# Patient Record
Sex: Female | Born: 1990 | Race: Black or African American | Hispanic: No | Marital: Single | Smoking: Never smoker
Health system: Southern US, Community
[De-identification: ages and names within clinical notes are randomized; demographics above are authoritative.]

## PROBLEM LIST (undated history)

## (undated) DIAGNOSIS — J45909 Unspecified asthma, uncomplicated: Secondary | ICD-10-CM

## (undated) DIAGNOSIS — L309 Dermatitis, unspecified: Secondary | ICD-10-CM

## (undated) HISTORY — PX: TUBAL LIGATION: SHX77

---

## 2015-12-23 ENCOUNTER — Encounter (HOSPITAL_COMMUNITY): Payer: Self-pay

## 2015-12-23 DIAGNOSIS — M545 Low back pain: Secondary | ICD-10-CM | POA: Insufficient documentation

## 2015-12-23 DIAGNOSIS — Z91040 Latex allergy status: Secondary | ICD-10-CM | POA: Insufficient documentation

## 2015-12-23 DIAGNOSIS — Z872 Personal history of diseases of the skin and subcutaneous tissue: Secondary | ICD-10-CM | POA: Insufficient documentation

## 2015-12-23 DIAGNOSIS — J45901 Unspecified asthma with (acute) exacerbation: Secondary | ICD-10-CM | POA: Insufficient documentation

## 2015-12-23 DIAGNOSIS — Z79899 Other long term (current) drug therapy: Secondary | ICD-10-CM | POA: Insufficient documentation

## 2015-12-23 MED ORDER — ALBUTEROL SULFATE (2.5 MG/3ML) 0.083% IN NEBU
INHALATION_SOLUTION | RESPIRATORY_TRACT | Status: AC
  Filled 2015-12-23: qty 6

## 2015-12-23 MED ORDER — ALBUTEROL SULFATE (2.5 MG/3ML) 0.083% IN NEBU
5.0000 mg | INHALATION_SOLUTION | Freq: Once | RESPIRATORY_TRACT | Status: AC
  Administered 2015-12-23: 5 mg via RESPIRATORY_TRACT

## 2015-12-23 NOTE — ED Notes (Signed)
Pt here for asthma exacerbation. She states for the past 2 weeks she has a flare up when just walking short distances. Pt has exp wheezing at triage, RR unlabored, speaking clear complete sentences. She reports HA.

## 2015-12-24 ENCOUNTER — Emergency Department (HOSPITAL_COMMUNITY)
Admission: EM | Admit: 2015-12-24 | Discharge: 2015-12-24 | Disposition: A | Discharge status: Home / Self Care | Payer: Self-pay | Attending: Emergency Medicine | Admitting: Emergency Medicine

## 2015-12-24 DIAGNOSIS — J45901 Unspecified asthma with (acute) exacerbation: Secondary | ICD-10-CM

## 2015-12-24 HISTORY — DX: Dermatitis, unspecified: L30.9

## 2015-12-24 HISTORY — DX: Unspecified asthma, uncomplicated: J45.909

## 2015-12-24 MED ORDER — PREDNISONE 20 MG PO TABS
60.0000 mg | ORAL_TABLET | Freq: Once | ORAL | Status: AC
  Administered 2015-12-24: 60 mg via ORAL
  Filled 2015-12-24: qty 3

## 2015-12-24 MED ORDER — IPRATROPIUM-ALBUTEROL 0.5-2.5 (3) MG/3ML IN SOLN
3.0000 mL | Freq: Once | RESPIRATORY_TRACT | Status: AC
  Administered 2015-12-24: 3 mL via RESPIRATORY_TRACT
  Filled 2015-12-24: qty 3

## 2015-12-24 MED ORDER — PREDNISONE 50 MG PO TABS: ORAL_TABLET | ORAL | Status: DC

## 2015-12-24 MED ORDER — ALBUTEROL SULFATE (2.5 MG/3ML) 0.083% IN NEBU
INHALATION_SOLUTION | RESPIRATORY_TRACT | Status: AC
  Filled 2015-12-24: qty 3

## 2015-12-24 MED ORDER — ALBUTEROL SULFATE HFA 108 (90 BASE) MCG/ACT IN AERS: 1.0000 | INHALATION_SPRAY | Freq: Four times a day (QID) | RESPIRATORY_TRACT | Status: DC | PRN

## 2015-12-24 MED ORDER — IPRATROPIUM BROMIDE 0.02 % IN SOLN
RESPIRATORY_TRACT | Status: DC
Start: 2015-12-24 — End: 2015-12-24
  Filled 2015-12-24: qty 2.5

## 2015-12-24 NOTE — Discharge Instructions (Signed)
Asthma, Adult Asthma is a condition of the lungs in which the airways tighten and narrow. Asthma can make it hard to breathe. Asthma cannot be cured, but medicine and lifestyle changes can help control it. Asthma may be started (triggered) by:  Animal skin flakes (dander).  Dust.  Cockroaches.  Pollen.  Mold.  Smoke.  Cleaning products.  Hair sprays or aerosol sprays.  Paint fumes or strong smells.  Cold air, weather changes, and winds.  Crying or laughing hard.  Stress.  Certain medicines or drugs.  Foods, such as dried fruit, potato chips, and sparkling grape juice.  Infections or conditions (colds, flu).  Exercise.  Certain medical conditions or diseases.  Exercise or tiring activities. HOME CARE   Take medicine as told by your doctor.  Use a peak flow meter as told by your doctor. A peak flow meter is a tool that measures how well the lungs are working.  Record and keep track of the peak flow meter's readings.  Understand and use the asthma action plan. An asthma action plan is a written plan for taking care of your asthma and treating your attacks.  To help prevent asthma attacks:  Do not smoke. Stay away from secondhand smoke.  Change your heating and air conditioning filter often.  Limit your use of fireplaces and wood stoves.  Get rid of pests (such as roaches and mice) and their droppings.  Throw away plants if you see mold on them.  Clean your floors. Dust regularly. Use cleaning products that do not smell.  Have someone vacuum when you are not home. Use a vacuum cleaner with a HEPA filter if possible.  Replace carpet with wood, tile, or vinyl flooring. Carpet can trap animal skin flakes and dust.  Use allergy-proof pillows, mattress covers, and box spring covers.  Wash bed sheets and blankets every week in hot water and dry them in a dryer.  Use blankets that are made of polyester or cotton.  Clean bathrooms and kitchens with bleach.  If possible, have someone repaint the walls in these rooms with mold-resistant paint. Keep out of the rooms that are being cleaned and painted.  Wash hands often. GET HELP IF:  You have make a whistling sound when breaking (wheeze), have shortness of breath, or have a cough even if taking medicine to prevent attacks.  The colored mucus you cough up (sputum) is thicker than usual.  The colored mucus you cough up changes from clear or white to yellow, green, gray, or bloody.  You have problems from the medicine you are taking such as:  A rash.  Itching.  Swelling.  Trouble breathing.  You need reliever medicines more than 2-3 times a week.  Your peak flow measurement is still at 50-79% of your personal best after following the action plan for 1 hour.  You have a fever. GET HELP RIGHT AWAY IF:   You seem to be worse and are not responding to medicine during an asthma attack.  You are short of breath even at rest.  You get short of breath when doing very little activity.  You have trouble eating, drinking, or talking.  You have chest pain.  You have a fast heartbeat.  Your lips or fingernails start to turn blue.  You are light-headed, dizzy, or faint.  Your peak flow is less than 50% of your personal best.   This information is not intended to replace advice given to you by your health care provider. Make sure   you discuss any questions you have with your health care provider.   Document Released: 03/12/2008 Document Revised: 06/15/2015 Document Reviewed: 04/23/2013 Elsevier Interactive Patient Education 2016 [employer]  

## 2015-12-24 NOTE — ED Provider Notes (Signed)
CSN: [id number]     Arrival date & time 12/23/15  2324 History  By signing my name below, I, Doreatha Martin, attest that this documentation has been prepared under the direction and in the presence of Zadie Rhine, MD. Electronically Signed: Doreatha Martin, ED Scribe. 12/24/2015. 2:44 AM.    Chief Complaint  Patient presents with  . Asthma   The history is provided by the patient. No language interpreter was used.   HPI Comments: Glenda Merritt is a 25 y.o. female with h/o asthma who presents to the Emergency Department complaining of moderate, worsening SOB onset 2 weeks ago with associated occasionally productive cough, chest tightness, wheezing. She also reports mild lower back pain, which is exacerbated with breathing. Pt states improvement of symptoms with breathing tx given in the ED. She states she has an albuterol inhaler at home. Pt states h/o similar symptoms with prior asthma flare-ups, but more severe. She states her asthma has been very well controlled since age 14. She has not been using steroids recently. Pt is a non-smoker. No daily medications. Pt does not take birth control. She denies stabbing CP, emesis, fever, hemoptysis, abdominal pain, leg swelling.   Past Medical History  Diagnosis Date  . Asthma   . Eczema    Past Surgical History  Procedure Laterality Date  . Tubal ligation     No family history on file. Social History  Substance Use Topics  . Smoking status: Never Smoker   . Smokeless tobacco: None  . Alcohol Use: Yes   OB History    No data available     Review of Systems  Constitutional: Negative for fever.  Respiratory: Positive for cough, chest tightness, shortness of breath and wheezing.   Cardiovascular: Negative for chest pain and leg swelling.  Gastrointestinal: Negative for vomiting and abdominal pain.  Musculoskeletal: Positive for back pain.  All other systems reviewed and are negative.  Allergies  Shellfish allergy and Latex  Home  Medications   Prior to Admission medications   Medication Sig Start Date End Date Taking? Authorizing Provider  albuterol (PROVENTIL HFA;VENTOLIN HFA) 108 (90 Base) MCG/ACT inhaler Inhale 2 puffs into the lungs every 6 (six) hours as needed for wheezing or shortness of breath.   Yes Historical Provider, MD   BP 109/65 mmHg  Pulse 92  Temp(Src) 97.7 F (36.5 C) (Oral)  Resp 23  SpO2 98%  LMP 12/06/2015 Physical Exam CONSTITUTIONAL: Well developed/well nourished HEAD: Normocephalic/atraumatic EYES: EOMI/PERRL ENMT: Mucous membranes moist NECK: supple no meningeal signs SPINE/BACK:entire spine nontender CV: S1/S2 noted, no murmurs/rubs/gallops noted LUNGS: Wheezing bilaterally, but no distress noted.  ABDOMEN: soft, nontender NEURO: Pt is awake/alert/appropriate, moves all extremitiesx4.  No facial droop.   EXTREMITIES: pulses normal/equal, full ROM. No lower extremity edema.  SKIN: warm, color normal. Eczema noted.  PSYCH: no abnormalities of mood noted, alert and oriented to situation   ED Course  Procedures  Medications  albuterol (PROVENTIL) (2.5 MG/3ML) 0.083% nebulizer solution 5 mg (5 mg Nebulization Given 12/23/15 2331)  ipratropium-albuterol (DUONEB) 0.5-2.5 (3) MG/3ML nebulizer solution 3 mL (3 mLs Nebulization Given 12/24/15 0116)  predniSONE (DELTASONE) tablet 60 mg (60 mg Oral Given 12/24/15 0116)    DIAGNOSTIC STUDIES: Oxygen Saturation is 99% on RA, normal by my interpretation.    COORDINATION OF CARE: 2:42 AM Discussed treatment plan with pt at bedside which includes breathing treatment, steroids and pt agreed to plan.   On my arrival pt was sleeping She felt improved No hypoxia She  did have residual wheeze and I offered more nebs, but she declined Will place on prednisone for homegoing Pt stable for d/c home I doubt ACS/PE/CHF at this time as cause of her symptoms   MDM   Final diagnoses:  Asthma attack    Nursing notes including past medical  history and social history reviewed and considered in documentation   I personally performed the services described in this documentation, which was scribed in my presence. The recorded information has been reviewed and is accurate.       Zadie Rhineonald Mesa Janus, MD 12/24/15 [phone]

## 2015-12-24 NOTE — ED Notes (Signed)
Pt left with all his belongings and ambulated out of the treatment area.  

## 2016-12-08 ENCOUNTER — Encounter (HOSPITAL_COMMUNITY): Payer: Self-pay

## 2016-12-08 ENCOUNTER — Emergency Department (HOSPITAL_COMMUNITY)
Admission: EM | Admit: 2016-12-08 | Discharge: 2016-12-08 | Disposition: A | Discharge status: Home / Self Care | Payer: Self-pay | Attending: Emergency Medicine | Admitting: Emergency Medicine

## 2016-12-08 DIAGNOSIS — J45909 Unspecified asthma, uncomplicated: Secondary | ICD-10-CM | POA: Insufficient documentation

## 2016-12-08 DIAGNOSIS — N390 Urinary tract infection, site not specified: Secondary | ICD-10-CM

## 2016-12-08 DIAGNOSIS — Z91040 Latex allergy status: Secondary | ICD-10-CM | POA: Insufficient documentation

## 2016-12-08 LAB — URINALYSIS, ROUTINE W REFLEX MICROSCOPIC
Bilirubin Urine: NEGATIVE
Glucose, UA: NEGATIVE mg/dL
Hgb urine dipstick: NEGATIVE
KETONES UR: NEGATIVE mg/dL
Nitrite: NEGATIVE
PROTEIN: 100 mg/dL — AB
Specific Gravity, Urine: 1.024 (ref 1.005–1.030)
pH: 5 (ref 5.0–8.0)

## 2016-12-08 MED ORDER — IBUPROFEN 200 MG PO TABS
400.0000 mg | ORAL_TABLET | Freq: Once | ORAL | Status: AC
  Administered 2016-12-08: 400 mg via ORAL
  Filled 2016-12-08: qty 2

## 2016-12-08 MED ORDER — CEPHALEXIN 500 MG PO CAPS
1000.0000 mg | ORAL_CAPSULE | Freq: Once | ORAL | Status: AC
  Administered 2016-12-08: 1000 mg via ORAL
  Filled 2016-12-08: qty 2

## 2016-12-08 MED ORDER — PHENAZOPYRIDINE HCL 200 MG PO TABS: 200.0000 mg | ORAL_TABLET | Freq: Three times a day (TID) | ORAL | 0 refills | Status: DC

## 2016-12-08 MED ORDER — CEPHALEXIN 500 MG PO CAPS: 500.0000 mg | ORAL_CAPSULE | Freq: Four times a day (QID) | ORAL | 0 refills | Status: DC

## 2016-12-08 MED ORDER — PHENAZOPYRIDINE HCL 200 MG PO TABS
200.0000 mg | ORAL_TABLET | Freq: Once | ORAL | Status: AC
  Administered 2016-12-08: 200 mg via ORAL
  Filled 2016-12-08: qty 1

## 2016-12-08 NOTE — ED Notes (Signed)
Pt taken to collect urine specimen. 

## 2016-12-08 NOTE — ED Notes (Addendum)
Pt c/o frequent urination and dysuria over 2 weeks with pain and frequency increasing over two weeks. Pt states using AZO pills to help.

## 2016-12-08 NOTE — ED Provider Notes (Signed)
[facility]-EMERGENCY DEPT Provider Note   CSN: [id number] Arrival date & time: 12/08/16  2116     History   Chief Complaint Chief Complaint  Patient presents with  . Urinary Frequency    HPI Glenda Merritt is a 26 y.o. female.  Patient c/o urinary frequency, urgency, and dysuria, for the past 2 weeks. Tried otc meds, but didn't help. Hx uncomplicated bladder infxn/uti. No recent abx use. No fever or chills. No vomiting. Other than suprapubic discomfort, no abd pain. No flank pain. Denies vaginal d/c or bleeding. Prior tubal ligation.    The history is provided by the patient.  Urinary Frequency     Past Medical History:  Diagnosis Date  . Asthma   . Eczema     There are no active problems to display for this patient.   Past Surgical History:  Procedure Laterality Date  . TUBAL LIGATION      OB History    No data available       Home Medications    Prior to Admission medications   Medication Sig Start Date End Date Taking? Authorizing Provider  albuterol (PROVENTIL HFA;VENTOLIN HFA) 108 (90 Base) MCG/ACT inhaler Inhale 1-2 puffs into the lungs every 6 (six) hours as needed for wheezing or shortness of breath. 12/24/15  Yes Zadie Rhine, MD  Amlodipine-Olmesartan (AZOR PO) Take 2 tablets by mouth once.   Yes Historical Provider, MD  predniSONE (DELTASONE) 50 MG tablet One tablet PO daily for 4 days Patient not taking: Reported on 12/08/2016 12/24/15   Zadie Rhine, MD    Family History History reviewed. No pertinent family history.  Social History Social History  Substance Use Topics  . Smoking status: Never Smoker  . Smokeless tobacco: Never Used  . Alcohol use Yes     Allergies   Shellfish allergy and Latex   Review of Systems Review of Systems  Constitutional: Negative for chills and fever.  Gastrointestinal: Negative for vomiting.  Genitourinary: Positive for dysuria and frequency.     Physical Exam Updated Vital Signs BP 135/85 (BP  Location: Right Arm)   Pulse 85   Temp 98.3 F (36.8 C) (Oral)   Resp 18   Ht 5\' 1"  (1.549 m)   Wt 119.1 kg   SpO2 100%   BMI 49.62 kg/m   Physical Exam  Constitutional: She appears well-developed and well-nourished. No distress.  HENT:  Head: Atraumatic.  Eyes: Conjunctivae are normal. No scleral icterus.  Neck: Neck supple. No tracheal deviation present.  Cardiovascular: Normal rate.   Pulmonary/Chest: Effort normal. No respiratory distress.  Abdominal: Normal appearance. She exhibits no distension. There is no tenderness.  Genitourinary:  Genitourinary Comments: No cva tenderness  Musculoskeletal: She exhibits no edema.  Neurological: She is alert.  Skin: Skin is warm and dry. No rash noted.  Psychiatric: She has a normal mood and affect.  Nursing note and vitals reviewed.    ED Treatments / Results  Labs (all labs ordered are listed, but only abnormal results are displayed) Results for orders placed or performed during the hospital encounter of 12/08/16  Urinalysis, Routine w reflex microscopic  Result Value Ref Range   Color, Urine YELLOW YELLOW   APPearance CLOUDY (A) CLEAR   Specific Gravity, Urine 1.024 1.005 - 1.030   pH 5.0 5.0 - 8.0   Glucose, UA NEGATIVE NEGATIVE mg/dL   Hgb urine dipstick NEGATIVE NEGATIVE   Bilirubin Urine NEGATIVE NEGATIVE   Ketones, ur NEGATIVE NEGATIVE mg/dL   Protein, ur 782 (  A) NEGATIVE mg/dL   Nitrite NEGATIVE NEGATIVE   Leukocytes, UA LARGE (A) NEGATIVE   RBC / HPF 6-30 0 - 5 RBC/hpf   WBC, UA TOO NUMEROUS TO COUNT 0 - 5 WBC/hpf   Bacteria, UA RARE (A) NONE SEEN   Squamous Epithelial / LPF 0-5 (A) NONE SEEN   WBC Clumps PRESENT    Mucous PRESENT    EKG  EKG Interpretation None       Radiology No results found.  Procedures Procedures (including critical care time)  Medications Ordered in ED Medications  ibuprofen (ADVIL,MOTRIN) tablet 400 mg (not administered)  cephALEXin (KEFLEX) capsule 1,000 mg (not  administered)  phenazopyridine (PYRIDIUM) tablet 200 mg (not administered)     Initial Impression / Assessment and Plan / ED Course  I have reviewed the triage vital signs and the nursing notes.  Pertinent labs & imaging results that were available during my care of the patient were reviewed by me and considered in my medical decision making (see chart for details).  Confirmed nkda.   Keflex po. Motrin po.   Pt appears stable for d/c.     Final Clinical Impressions(s) / ED Diagnoses   Final diagnoses:  None    New Prescriptions New Prescriptions   No medications on file     Cathren LaineKevin Nema Oatley, MD 12/08/16 2242

## 2016-12-08 NOTE — ED Triage Notes (Signed)
Burning with urination for about 2 weeks with nausea voiced no hematuria.

## 2016-12-08 NOTE — ED Notes (Signed)
All triage done by Clerance LavBrian Anajah Sterbenz RN

## 2016-12-08 NOTE — Discharge Instructions (Signed)
It was our pleasure to provide your ER care today - we hope that you feel better.  Drink plenty of fluids.  Take antibiotic (keflex) as prescribed.  Take pyridium as need for bladder spams.  Take acetaminophen and ibuprofen as need for pain.   Follow up with primary care doctor in the next few days for recheck if symptoms fail to improve/resolve.  Return to ER if worse, high fevers, persistent vomiting, severe abdominal pain, other concern.

## 2017-06-24 ENCOUNTER — Encounter (HOSPITAL_COMMUNITY): Payer: Self-pay

## 2017-06-24 ENCOUNTER — Emergency Department (HOSPITAL_COMMUNITY)
Admission: EM | Admit: 2017-06-24 | Discharge: 2017-06-24 | Disposition: A | Discharge status: Home / Self Care | Payer: Self-pay | Attending: Emergency Medicine | Admitting: Emergency Medicine

## 2017-06-24 DIAGNOSIS — R109 Unspecified abdominal pain: Secondary | ICD-10-CM | POA: Insufficient documentation

## 2017-06-24 DIAGNOSIS — Z5321 Procedure and treatment not carried out due to patient leaving prior to being seen by health care provider: Secondary | ICD-10-CM | POA: Insufficient documentation

## 2017-06-24 LAB — CBC
HCT: 36 % (ref 36.0–46.0)
HEMOGLOBIN: 11.7 g/dL — AB (ref 12.0–15.0)
MCH: 26.1 pg (ref 26.0–34.0)
MCHC: 32.5 g/dL (ref 30.0–36.0)
MCV: 80.2 fL (ref 78.0–100.0)
PLATELETS: 303 10*3/uL (ref 150–400)
RBC: 4.49 MIL/uL (ref 3.87–5.11)
RDW: 14.1 % (ref 11.5–15.5)
WBC: 8.8 10*3/uL (ref 4.0–10.5)

## 2017-06-24 LAB — COMPREHENSIVE METABOLIC PANEL
ALK PHOS: 54 U/L (ref 38–126)
ALT: 22 U/L (ref 14–54)
ANION GAP: 9 (ref 5–15)
AST: 30 U/L (ref 15–41)
Albumin: 4.2 g/dL (ref 3.5–5.0)
BUN: 15 mg/dL (ref 6–20)
CALCIUM: 9.5 mg/dL (ref 8.9–10.3)
CO2: 23 mmol/L (ref 22–32)
CREATININE: 1.01 mg/dL — AB (ref 0.44–1.00)
Chloride: 106 mmol/L (ref 101–111)
Glucose, Bld: 111 mg/dL — ABNORMAL HIGH (ref 65–99)
Potassium: 3.2 mmol/L — ABNORMAL LOW (ref 3.5–5.1)
SODIUM: 138 mmol/L (ref 135–145)
Total Bilirubin: 0.4 mg/dL (ref 0.3–1.2)
Total Protein: 8.1 g/dL (ref 6.5–8.1)

## 2017-06-24 LAB — I-STAT BETA HCG BLOOD, ED (MC, WL, AP ONLY)

## 2017-06-24 LAB — LIPASE, BLOOD: LIPASE: 21 U/L (ref 11–51)

## 2017-06-24 MED ORDER — ONDANSETRON 4 MG PO TBDP
4.0000 mg | ORAL_TABLET | Freq: Once | ORAL | Status: AC | PRN
  Administered 2017-06-24: 4 mg via ORAL
  Filled 2017-06-24: qty 1

## 2017-06-24 NOTE — ED Notes (Signed)
Pt has made two unsuccessful attempts to provide a urine specimen at this point.

## 2017-06-24 NOTE — ED Notes (Signed)
Pt came out of the room asking about the wait and if she could be skipped ahead due to the fact that she has work in a couple of hours. She was informed that was something we could not do.

## 2017-06-24 NOTE — ED Notes (Signed)
Pt awoke from sleep with abdominal pain that radiates on the right around to the back. Reports nausea, denies diarrhea.

## 2017-06-24 NOTE — ED Notes (Signed)
Pt updated on wait.  Sts nausea has decreased.  Pt seems to be more comfortable.

## 2018-03-22 ENCOUNTER — Emergency Department (HOSPITAL_COMMUNITY)
Admission: EM | Admit: 2018-03-22 | Discharge: 2018-03-22 | Disposition: A | Discharge status: Home / Self Care | Payer: Self-pay | Attending: Emergency Medicine | Admitting: Emergency Medicine

## 2018-03-22 ENCOUNTER — Encounter (HOSPITAL_COMMUNITY): Payer: Self-pay | Admitting: *Deleted

## 2018-03-22 DIAGNOSIS — R1013 Epigastric pain: Secondary | ICD-10-CM | POA: Insufficient documentation

## 2018-03-22 DIAGNOSIS — R112 Nausea with vomiting, unspecified: Secondary | ICD-10-CM | POA: Insufficient documentation

## 2018-03-22 LAB — URINALYSIS, ROUTINE W REFLEX MICROSCOPIC
BILIRUBIN URINE: NEGATIVE
Glucose, UA: NEGATIVE mg/dL
KETONES UR: NEGATIVE mg/dL
Leukocytes, UA: NEGATIVE
NITRITE: NEGATIVE
PH: 5 (ref 5.0–8.0)
Protein, ur: 100 mg/dL — AB
Specific Gravity, Urine: 1.021 (ref 1.005–1.030)

## 2018-03-22 LAB — CBC WITH DIFFERENTIAL/PLATELET
BASOS PCT: 0 %
Basophils Absolute: 0 10*3/uL (ref 0.0–0.1)
Eosinophils Absolute: 0.2 10*3/uL (ref 0.0–0.7)
Eosinophils Relative: 3 %
HEMATOCRIT: 35.5 % — AB (ref 36.0–46.0)
HEMOGLOBIN: 11.5 g/dL — AB (ref 12.0–15.0)
LYMPHS ABS: 2 10*3/uL (ref 0.7–4.0)
LYMPHS PCT: 33 %
MCH: 26.9 pg (ref 26.0–34.0)
MCHC: 32.4 g/dL (ref 30.0–36.0)
MCV: 82.9 fL (ref 78.0–100.0)
Monocytes Absolute: 0.3 10*3/uL (ref 0.1–1.0)
Monocytes Relative: 5 %
NEUTROS ABS: 3.6 10*3/uL (ref 1.7–7.7)
NEUTROS PCT: 59 %
Platelets: 262 10*3/uL (ref 150–400)
RBC: 4.28 MIL/uL (ref 3.87–5.11)
RDW: 13.8 % (ref 11.5–15.5)
WBC: 6.1 10*3/uL (ref 4.0–10.5)

## 2018-03-22 LAB — COMPREHENSIVE METABOLIC PANEL
ALBUMIN: 3.9 g/dL (ref 3.5–5.0)
ALT: 17 U/L (ref 14–54)
AST: 18 U/L (ref 15–41)
Alkaline Phosphatase: 47 U/L (ref 38–126)
Anion gap: 4 — ABNORMAL LOW (ref 5–15)
BUN: 11 mg/dL (ref 6–20)
CHLORIDE: 106 mmol/L (ref 101–111)
CO2: 30 mmol/L (ref 22–32)
Calcium: 9 mg/dL (ref 8.9–10.3)
Creatinine, Ser: 1.01 mg/dL — ABNORMAL HIGH (ref 0.44–1.00)
GFR calc Af Amer: >60 mL/min (ref >60)
GFR calc non Af Amer: >60 mL/min (ref >60)
GLUCOSE: 88 mg/dL (ref 65–99)
POTASSIUM: 3.7 mmol/L (ref 3.5–5.1)
SODIUM: 140 mmol/L (ref 135–145)
TOTAL PROTEIN: 7.4 g/dL (ref 6.5–8.1)
Total Bilirubin: 0.5 mg/dL (ref 0.3–1.2)

## 2018-03-22 LAB — PREGNANCY, URINE: PREG TEST UR: NEGATIVE

## 2018-03-22 LAB — LIPASE, BLOOD: Lipase: 21 U/L (ref 11–51)

## 2018-03-22 MED ORDER — DICYCLOMINE HCL 20 MG PO TABS: 20.0000 mg | ORAL_TABLET | Freq: Two times a day (BID) | ORAL | 0 refills | Status: DC

## 2018-03-22 MED ORDER — ONDANSETRON HCL 4 MG/2ML IJ SOLN
4.0000 mg | Freq: Once | INTRAMUSCULAR | Status: AC
  Administered 2018-03-22: 4 mg via INTRAVENOUS
  Filled 2018-03-22: qty 2

## 2018-03-22 MED ORDER — SODIUM CHLORIDE 0.9 % IV BOLUS
1000.0000 mL | Freq: Once | INTRAVENOUS | Status: AC
  Administered 2018-03-22: 1000 mL via INTRAVENOUS

## 2018-03-22 MED ORDER — GI COCKTAIL ~~LOC~~
30.0000 mL | Freq: Once | ORAL | Status: AC
  Administered 2018-03-22: 30 mL via ORAL
  Filled 2018-03-22: qty 30

## 2018-03-22 MED ORDER — SUCRALFATE 1 G PO TABS
1.0000 g | ORAL_TABLET | Freq: Once | ORAL | Status: AC
  Administered 2018-03-22: 1 g via ORAL
  Filled 2018-03-22: qty 1

## 2018-03-22 MED ORDER — SUCRALFATE 1 GM/10ML PO SUSP: 1.0000 g | Freq: Three times a day (TID) | ORAL | 0 refills | Status: DC

## 2018-03-22 MED ORDER — METOCLOPRAMIDE HCL 5 MG/ML IJ SOLN
10.0000 mg | Freq: Once | INTRAMUSCULAR | Status: AC
Start: 2018-03-22 — End: 2018-03-22
  Administered 2018-03-22: 10 mg via INTRAVENOUS
  Filled 2018-03-22: qty 2

## 2018-03-22 MED ORDER — ONDANSETRON 4 MG PO TBDP: 4.0000 mg | ORAL_TABLET | Freq: Three times a day (TID) | ORAL | 0 refills | Status: DC | PRN

## 2018-03-22 NOTE — Discharge Instructions (Signed)

## 2018-03-22 NOTE — ED Triage Notes (Signed)
Pt complains of abdominal pain, nausea, vomiting since last night. Pt denies diarrhea.

## 2018-03-22 NOTE — ED Notes (Signed)
Patient state she want to be discharged she have emergency at home. Pt refused discharged vital sign.

## 2018-03-22 NOTE — ED Provider Notes (Signed)
Emergency Department Provider Note   I have reviewed the triage vital signs and the nursing notes.   HISTORY  Chief Complaint Abdominal Pain   HPI Glenda Merritt is a 27 y.o. female with PMH of Asthma resents to the emergency department for evaluation of epigastric abdominal pain with associated nausea and vomiting.  Symptoms began yesterday afternoon/evening when she was driving to work to work the third shift.  She describes intermittent, sharp pains in the epigastric region.  No radiation of pain.  No modifying factors.  She developed nausea and vomiting during work but did not leave work.  She drank a seltzer water which did not improve her symptoms.  She was unable to eat dinner so is unsure how food affects this.  She has not tried any other medications.  She denies any lower abdominal pain.  No blood in the emesis.  No diarrhea.  Denies any chest pain or difficulty breathing.  Unknown fevers but nothing subjective.   Past Medical History:  Diagnosis Date  . Asthma   . Eczema     There are no active problems to display for this patient.   Past Surgical History:  Procedure Laterality Date  . TUBAL LIGATION      Current Outpatient Rx  . Order #: [id number] Class: Historical Med  . Order #: [id number] Class: Print  . Order #: [id number] Class: Print  . Order #: [id number] Class: Print  . Order #: [id number] Class: Print  . Order #: [id number] Class: Print  . Order #: [id number] Class: Print  . Order #: [id number] Class: Print    Allergies Shellfish allergy; Lactose intolerance (gi); and Latex  No family history on file.  Social History Social History   Tobacco Use  . Smoking status: Never Smoker  . Smokeless tobacco: Never Used  Substance Use Topics  . Alcohol use: Yes  . Drug use: Yes    Types: Marijuana    Review of Systems  Constitutional: No fever/chills Eyes: No visual changes. ENT: No sore throat. Cardiovascular: Denies chest pain. Respiratory: Denies  shortness of breath. Gastrointestinal: Positive epigastric abdominal pain. Positive nausea and vomiting.  No diarrhea.  No constipation. Genitourinary: Negative for dysuria. Musculoskeletal: Negative for back pain. Skin: Negative for rash. Neurological: Negative for headaches, focal weakness or numbness.  10-point ROS otherwise negative.  ____________________________________________   PHYSICAL EXAM:  VITAL SIGNS: ED Triage Vitals  Enc Vitals Group     BP 03/22/18 0743 118/85     Pulse Rate 03/22/18 0743 96     Resp 03/22/18 0743 18     Temp 03/22/18 0743 98.5 F (36.9 C)     Temp Source 03/22/18 0743 Oral     SpO2 03/22/18 0743 98 %     Pain Score 03/22/18 0748 10   Constitutional: Alert and oriented. Well appearing and in no acute distress. Eyes: Conjunctivae are normal.  Head: Atraumatic. Nose: No congestion/rhinnorhea. Mouth/Throat: Mucous membranes are moist.  Oropharynx non-erythematous. Neck: No stridor.  Cardiovascular: Normal rate, regular rhythm. Good peripheral circulation. Grossly normal heart sounds.   Respiratory: Normal respiratory effort.  No retractions. Lungs CTAB. Gastrointestinal: Soft with mild epigastric tenderness. No rebound or guarding. Negative Murphy's sign. No distention.  Musculoskeletal: No lower extremity tenderness nor edema. No gross deformities of extremities. Neurologic:  Normal speech and language. No gross focal neurologic deficits are appreciated.  Skin:  Skin is warm, dry and intact. No rash noted.  ____________________________________________   LABS (all labs ordered are listed, but only abnormal results are displayed)  Labs Reviewed  URINALYSIS, ROUTINE W REFLEX MICROSCOPIC - Abnormal; Notable for the following components:      Result Value   Hgb urine dipstick LARGE (*)    Protein, ur 100 (*)    RBC / HPF >50 (*)    Bacteria, UA RARE (*)    All other components within normal limits  COMPREHENSIVE METABOLIC PANEL - Abnormal;  Notable for the following components:   Creatinine, Ser 1.01 (*)    Anion gap 4 (*)    All other components within normal limits  CBC WITH DIFFERENTIAL/PLATELET - Abnormal; Notable for the following components:   Hemoglobin 11.5 (*)    HCT 35.5 (*)    All other components within normal limits  PREGNANCY, URINE  LIPASE, BLOOD   ____________________________________________  RADIOLOGY  None ____________________________________________   PROCEDURES  Procedure(s) performed:   Procedures  None ____________________________________________   INITIAL IMPRESSION / ASSESSMENT AND PLAN / ED COURSE  Pertinent labs & imaging results that were available during my care of the patient were reviewed by me and considered in my medical decision making (see chart for details).  Patient presents to the emergency department for evaluation of epigastric abdominal discomfort with nausea and vomiting.  She has mild, diffuse epigastric tenderness with no focal right upper quadrant tenderness.  Negative Murphy sign.  Doubt cardiothoracic etiology given patient's age and presentation.  Plan for IV fluids, Zofran, Carafate and follow labs including UA and CMP. Only surgical history is a tubal ligation.   10:37 AM Patient feeling improved and states that she needs to get back to her children.  No concern for acute surgical process.  No indication for abdominal imaging at this time.  Plan for supportive care at home.  Return precautions provided.   At this time, I do not feel there is any life-threatening condition present. I have reviewed and discussed all results (EKG, imaging, lab, urine as appropriate), exam findings with patient. I have reviewed nursing notes and appropriate previous records.  I feel the patient is safe to be discharged home without further emergent workup. Discussed usual and customary return precautions. Patient and family (if present) verbalize understanding and are comfortable with  this plan.  Patient will follow-up with their primary care provider. If they do not have a primary care provider, information for follow-up has been provided to them. All questions have been answered.  ____________________________________________  FINAL CLINICAL IMPRESSION(S) / ED DIAGNOSES  Final diagnoses:  Epigastric pain  Non-intractable vomiting with nausea, unspecified vomiting type     MEDICATIONS GIVEN DURING THIS VISIT:  Medications  sodium chloride 0.9 % bolus 1,000 mL (0 mLs Intravenous Stopped 03/22/18 1013)  ondansetron (ZOFRAN) injection 4 mg (4 mg Intravenous Given 03/22/18 0824)  sucralfate (CARAFATE) tablet 1 g (1 g Oral Given 03/22/18 0827)  gi cocktail (Maalox,Lidocaine,Donnatal) (30 mLs Oral Given 03/22/18 1012)  metoCLOPramide (REGLAN) injection 10 mg (10 mg Intravenous Given 03/22/18 1012)     NEW OUTPATIENT MEDICATIONS STARTED DURING THIS VISIT:  New Prescriptions   DICYCLOMINE (BENTYL) 20 MG TABLET    Take 1 tablet (20 mg total) by mouth 2 (two) times daily.   ONDANSETRON (ZOFRAN ODT) 4 MG DISINTEGRATING TABLET    Take 1 tablet (4 mg total) by mouth every 8 (eight) hours as needed for nausea or vomiting.   SUCRALFATE (CARAFATE) 1 GM/10ML SUSPENSION    Take 10 mLs (1 g total) by mouth 4 (four) times daily -  with meals and at bedtime.  Note:  This document was prepared using Dragon voice recognition software and may include unintentional dictation errors.  Alona Bene, MD Emergency Medicine    Long, Arlyss Repress, MD 03/22/18 1038

## 2018-07-31 ENCOUNTER — Encounter (HOSPITAL_COMMUNITY): Payer: Self-pay | Admitting: *Deleted

## 2018-07-31 ENCOUNTER — Observation Stay (HOSPITAL_COMMUNITY)
Admission: EM | Admit: 2018-07-31 | Discharge: 2018-08-02 | Disposition: A | Discharge status: Home / Self Care | Payer: Self-pay | Attending: Surgery | Admitting: Surgery

## 2018-07-31 ENCOUNTER — Emergency Department (HOSPITAL_COMMUNITY): Payer: Self-pay

## 2018-07-31 ENCOUNTER — Encounter (HOSPITAL_COMMUNITY): Payer: Self-pay

## 2018-07-31 ENCOUNTER — Ambulatory Visit (HOSPITAL_COMMUNITY)
Admission: EM | Admit: 2018-07-31 | Discharge: 2018-07-31 | Disposition: A | Discharge status: Home / Self Care | Payer: Self-pay | Attending: Family Medicine | Admitting: Family Medicine

## 2018-07-31 DIAGNOSIS — Z79899 Other long term (current) drug therapy: Secondary | ICD-10-CM | POA: Insufficient documentation

## 2018-07-31 DIAGNOSIS — K8020 Calculus of gallbladder without cholecystitis without obstruction: Secondary | ICD-10-CM | POA: Diagnosis present

## 2018-07-31 DIAGNOSIS — K819 Cholecystitis, unspecified: Secondary | ICD-10-CM

## 2018-07-31 DIAGNOSIS — Z6841 Body Mass Index (BMI) 40.0 and over, adult: Secondary | ICD-10-CM | POA: Insufficient documentation

## 2018-07-31 DIAGNOSIS — K8010 Calculus of gallbladder with chronic cholecystitis without obstruction: Principal | ICD-10-CM | POA: Insufficient documentation

## 2018-07-31 DIAGNOSIS — R1013 Epigastric pain: Secondary | ICD-10-CM | POA: Insufficient documentation

## 2018-07-31 DIAGNOSIS — Z91013 Allergy to seafood: Secondary | ICD-10-CM | POA: Insufficient documentation

## 2018-07-31 DIAGNOSIS — R110 Nausea: Secondary | ICD-10-CM | POA: Insufficient documentation

## 2018-07-31 DIAGNOSIS — J45909 Unspecified asthma, uncomplicated: Secondary | ICD-10-CM | POA: Insufficient documentation

## 2018-07-31 DIAGNOSIS — R1011 Right upper quadrant pain: Secondary | ICD-10-CM

## 2018-07-31 DIAGNOSIS — Z91040 Latex allergy status: Secondary | ICD-10-CM | POA: Insufficient documentation

## 2018-07-31 DIAGNOSIS — Z9049 Acquired absence of other specified parts of digestive tract: Secondary | ICD-10-CM

## 2018-07-31 LAB — COMPREHENSIVE METABOLIC PANEL
ALBUMIN: 3.9 g/dL (ref 3.5–5.0)
ALK PHOS: 52 U/L (ref 38–126)
ALT: 16 U/L (ref 0–44)
ALT: 17 U/L (ref 0–44)
ANION GAP: 10 (ref 5–15)
ANION GAP: 9 (ref 5–15)
AST: 18 U/L (ref 15–41)
AST: 18 U/L (ref 15–41)
Albumin: 4.2 g/dL (ref 3.5–5.0)
Alkaline Phosphatase: 51 U/L (ref 38–126)
BILIRUBIN TOTAL: 0.5 mg/dL (ref 0.3–1.2)
BUN: 7 mg/dL (ref 6–20)
BUN: 9 mg/dL (ref 6–20)
CALCIUM: 9.9 mg/dL (ref 8.9–10.3)
CALCIUM: 9.7 mg/dL (ref 8.9–10.3)
CO2: 25 mmol/L (ref 22–32)
CO2: 26 mmol/L (ref 22–32)
CREATININE: 0.95 mg/dL (ref 0.44–1.00)
CREATININE: 0.96 mg/dL (ref 0.44–1.00)
Chloride: 102 mmol/L (ref 98–111)
Chloride: 103 mmol/L (ref 98–111)
GFR calc Af Amer: >60 mL/min (ref >60)
GFR calc non Af Amer: >60 mL/min (ref >60)
GLUCOSE: 95 mg/dL (ref 70–99)
Glucose, Bld: 97 mg/dL (ref 70–99)
POTASSIUM: 3.6 mmol/L (ref 3.5–5.1)
Potassium: 3.6 mmol/L (ref 3.5–5.1)
SODIUM: 138 mmol/L (ref 135–145)
Sodium: 137 mmol/L (ref 135–145)
Total Bilirubin: 0.4 mg/dL (ref 0.3–1.2)
Total Protein: 7.4 g/dL (ref 6.5–8.1)
Total Protein: 8 g/dL (ref 6.5–8.1)

## 2018-07-31 LAB — CBC WITH DIFFERENTIAL/PLATELET
Abs Immature Granulocytes: 0.01 10*3/uL (ref 0.00–0.07)
BASOS ABS: 0 10*3/uL (ref 0.0–0.1)
Basophils Relative: 1 %
EOS ABS: 0.2 10*3/uL (ref 0.0–0.5)
EOS PCT: 4 %
HCT: 40.3 % (ref 36.0–46.0)
Hemoglobin: 12.5 g/dL (ref 12.0–15.0)
IMMATURE GRANULOCYTES: 0 %
LYMPHS ABS: 2 10*3/uL (ref 0.7–4.0)
Lymphocytes Relative: 30 %
MCH: 25.6 pg — ABNORMAL LOW (ref 26.0–34.0)
MCHC: 31 g/dL (ref 30.0–36.0)
MCV: 82.6 fL (ref 80.0–100.0)
Monocytes Absolute: 0.5 10*3/uL (ref 0.1–1.0)
Monocytes Relative: 8 %
NEUTROS PCT: 57 %
Neutro Abs: 3.8 10*3/uL (ref 1.7–7.7)
PLATELETS: 312 10*3/uL (ref 150–400)
RBC: 4.88 MIL/uL (ref 3.87–5.11)
RDW: 13.5 % (ref 11.5–15.5)
WBC: 6.5 10*3/uL (ref 4.0–10.5)
nRBC: 0 % (ref 0.0–0.2)

## 2018-07-31 LAB — URINALYSIS, ROUTINE W REFLEX MICROSCOPIC
BACTERIA UA: NONE SEEN
Bilirubin Urine: NEGATIVE
GLUCOSE, UA: NEGATIVE mg/dL
Hgb urine dipstick: NEGATIVE
Ketones, ur: NEGATIVE mg/dL
Leukocytes, UA: NEGATIVE
NITRITE: NEGATIVE
Protein, ur: 100 mg/dL — AB
SPECIFIC GRAVITY, URINE: 1.025 (ref 1.005–1.030)
pH: 5 (ref 5.0–8.0)

## 2018-07-31 LAB — I-STAT BETA HCG BLOOD, ED (MC, WL, AP ONLY): I-stat hCG, quantitative: <5 m[IU]/mL (ref <5)

## 2018-07-31 LAB — CBC
HCT: 40.4 % (ref 36.0–46.0)
Hemoglobin: 12.5 g/dL (ref 12.0–15.0)
MCH: 26.3 pg (ref 26.0–34.0)
MCHC: 30.9 g/dL (ref 30.0–36.0)
MCV: 85.1 fL (ref 80.0–100.0)
NRBC: 0 % (ref 0.0–0.2)
PLATELETS: 296 10*3/uL (ref 150–400)
RBC: 4.75 MIL/uL (ref 3.87–5.11)
RDW: 13.5 % (ref 11.5–15.5)
WBC: 6.8 10*3/uL (ref 4.0–10.5)

## 2018-07-31 LAB — LIPASE, BLOOD
LIPASE: 25 U/L (ref 11–51)
Lipase: 22 U/L (ref 11–51)

## 2018-07-31 MED ORDER — LIDOCAINE VISCOUS HCL 2 % MT SOLN
OROMUCOSAL | Status: AC
  Filled 2018-07-31: qty 15

## 2018-07-31 MED ORDER — ALUM & MAG HYDROXIDE-SIMETH 200-200-20 MG/5ML PO SUSP
30.0000 mL | Freq: Once | ORAL | Status: AC
  Administered 2018-07-31: 30 mL via ORAL

## 2018-07-31 MED ORDER — SUCRALFATE 1 GM/10ML PO SUSP: 1.0000 g | Freq: Three times a day (TID) | ORAL | 0 refills | Status: AC

## 2018-07-31 MED ORDER — OMEPRAZOLE 20 MG PO CPDR: 20.0000 mg | DELAYED_RELEASE_CAPSULE | Freq: Every day | ORAL | 0 refills | Status: AC

## 2018-07-31 MED ORDER — ONDANSETRON 4 MG PO TBDP
4.0000 mg | ORAL_TABLET | Freq: Once | ORAL | Status: AC
  Administered 2018-07-31: 4 mg via ORAL

## 2018-07-31 MED ORDER — ONDANSETRON 4 MG PO TBDP: 4.0000 mg | ORAL_TABLET | Freq: Three times a day (TID) | ORAL | 0 refills | Status: DC | PRN

## 2018-07-31 MED ORDER — LIDOCAINE VISCOUS HCL 2 % MT SOLN
15.0000 mL | Freq: Once | OROMUCOSAL | Status: AC
  Administered 2018-07-31: 15 mL via ORAL

## 2018-07-31 MED ORDER — ALUM & MAG HYDROXIDE-SIMETH 200-200-20 MG/5ML PO SUSP
ORAL | Status: AC
Start: 2018-07-31 — End: ?
  Filled 2018-07-31: qty 30

## 2018-07-31 MED ORDER — FAMOTIDINE IN NACL 20-0.9 MG/50ML-% IV SOLN
20.0000 mg | Freq: Once | INTRAVENOUS | Status: AC
  Administered 2018-08-01: 20 mg via INTRAVENOUS
  Filled 2018-07-31: qty 50

## 2018-07-31 MED ORDER — ONDANSETRON 4 MG PO TBDP
ORAL_TABLET | ORAL | Status: AC
  Filled 2018-07-31: qty 1

## 2018-07-31 NOTE — ED Triage Notes (Signed)
Pt complains of right upper abdominal pain and nausea for the past 3 weeks. Pt went to urgent care, was sent to ED for ultrasound. Pt has some diarrhea today.

## 2018-07-31 NOTE — ED Provider Notes (Signed)
[facility]-EMERGENCY DEPT Provider Note   CSN: [id number] Arrival date & time: 07/31/18  1800     History   Chief Complaint Chief Complaint  Patient presents with  . Abdominal Pain    HPI Glenda Merritt is a 27 y.o. female.  Patient presents to the emergency department for evaluation of abdominal pain.  Patient has been experiencing upper and right-sided abdominal pain for 3 weeks intermittently.  She reports that pain will come on and then stay for a number of days before it resolves.  She has not identified anything that causes the pain.  She has, however, noted that when she eats she has a lot of indigestion.  It does not affect the abdominal pain, however.  She has not had any vomiting, diarrhea, constipation.  No melanotic stools.  She has not had fever.  Patient went to urgent care today and was referred here for possible ultrasound to rule out gallbladder disease.     Past Medical History:  Diagnosis Date  . Asthma   . Eczema     There are no active problems to display for this patient.   Past Surgical History:  Procedure Laterality Date  . TUBAL LIGATION       OB History   None      Home Medications    Prior to Admission medications   Medication Sig Start Date End Date Taking? Authorizing Provider  omeprazole (PRILOSEC) 20 MG capsule Take 1 capsule (20 mg total) by mouth daily. 07/31/18   Georgetta Haber, NP  ondansetron (ZOFRAN ODT) 4 MG disintegrating tablet Take 1 tablet (4 mg total) by mouth every 8 (eight) hours as needed for nausea or vomiting. 07/31/18   Georgetta Haber, NP  sucralfate (CARAFATE) 1 GM/10ML suspension Take 10 mLs (1 g total) by mouth 4 (four) times daily -  with meals and at bedtime. 07/31/18   Georgetta Haber, NP    Family History No family history on file.  Social History Social History   Tobacco Use  . Smoking status: Never Smoker  . Smokeless tobacco: Never Used  Substance Use Topics  . Alcohol  use: Yes  . Drug use: Yes    Types: Marijuana     Allergies   Shellfish allergy; Lactose intolerance (gi); and Latex   Review of Systems Review of Systems  Gastrointestinal: Positive for abdominal pain.  All other systems reviewed and are negative.    Physical Exam Updated Vital Signs BP 113/64 (BP Location: Right Arm)   Pulse 72   Temp 97.8 F (36.6 C) (Oral)   Resp 20   LMP 07/20/2018   SpO2 100%   Physical Exam  Constitutional: She is oriented to person, place, and time. She appears well-developed and well-nourished. No distress.  HENT:  Head: Normocephalic and atraumatic.  Right Ear: Hearing normal.  Left Ear: Hearing normal.  Nose: Nose normal.  Mouth/Throat: Oropharynx is clear and moist and mucous membranes are normal.  Eyes: Pupils are equal, round, and reactive to light. Conjunctivae and EOM are normal.  Neck: Normal range of motion. Neck supple.  Cardiovascular: Regular rhythm, S1 normal and S2 normal. Exam reveals no gallop and no friction rub.  No murmur heard. Pulmonary/Chest: Effort normal and breath sounds normal. No respiratory distress. She exhibits no tenderness.  Abdominal: Soft. Normal appearance and bowel sounds are normal. There is no hepatosplenomegaly. There is tenderness in the right upper quadrant and epigastric area. There is no rebound, no guarding,  no tenderness at McBurney's point and negative Murphy's sign. No hernia.  Musculoskeletal: Normal range of motion.  Neurological: She is alert and oriented to person, place, and time. She has normal strength. No cranial nerve deficit or sensory deficit. Coordination normal. GCS eye subscore is 4. GCS verbal subscore is 5. GCS motor subscore is 6.  Skin: Skin is warm, dry and intact. No rash noted. No cyanosis.  Psychiatric: She has a normal mood and affect. Her speech is normal and behavior is normal. Thought content normal.  Nursing note and vitals reviewed.    ED Treatments / Results   Labs (all labs ordered are listed, but only abnormal results are displayed) Labs Reviewed  URINALYSIS, ROUTINE W REFLEX MICROSCOPIC - Abnormal; Notable for the following components:      Result Value   Protein, ur 100 (*)    All other components within normal limits  LIPASE, BLOOD  COMPREHENSIVE METABOLIC PANEL  CBC  I-STAT BETA HCG BLOOD, ED ([facility], [facility], [facility] ONLY)    EKG None  Radiology US Abdomen Limited Ruq  Result Date: 08/01/2018 CLINICAL DATA:  Right upper quadrant pain for 4 months. EXAM: ULTRASOUND ABDOMEN LIMITED RIGHT UPPER QUADRANT COMPARISON:  None. FINDINGS: Gallbladder: The gallbladder is filled with stones creating a wall echo shadow complex with shadowing obscuring the posterior wall. No definite wall thickening, gallbladder wall thickness is 2 mm, but partially obscured due to adjacent gallstones. No pericholecystic fluid. Positive sonographic Murphy sign noted by sonographer. Common bile duct: Diameter: 4-5 mm, normal. Liver: No focal lesion identified. Slightly increased in parenchymal echogenicity compared to right kidney. Portal vein is patent on color Doppler imaging with normal direction of blood flow towards the liver. IMPRESSION: 1. Gallbladder filled with gallstones creating a wall echo shadow complex. While there is no pericholecystic fluid or wall thickening, technologist notes a positive sonographic Murphy sign which is suspicious for acute cholecystitis. 2. No biliary dilatation. Electronically Signed   By: Narda Rutherford M.D.   On: 08/01/2018 00:17    Procedures Procedures (including critical care time)  Medications Ordered in ED Medications  cefTRIAXone (ROCEPHIN) 2 g in sodium chloride 0.9 % 100 mL IVPB (has no administration in time range)  metoCLOPramide (REGLAN) injection 10 mg (has no administration in time range)  HYDROmorphone (DILAUDID) injection 1 mg (has no administration in time range)  lactated ringers infusion (has no administration in time  range)  famotidine (PEPCID) IVPB 20 mg premix (0 mg Intravenous Stopped 08/01/18 0055)  HYDROmorphone (DILAUDID) injection 1 mg (1 mg Intravenous Given 08/01/18 0049)  ondansetron (ZOFRAN) injection 4 mg (4 mg Intravenous Given 08/01/18 0048)     Initial Impression / Assessment and Plan / ED Course  I have reviewed the triage vital signs and the nursing notes.  Pertinent labs & imaging results that were available during my care of the patient were reviewed by me and considered in my medical decision making (see chart for details).     Patient presents to the emergency department for evaluation of abdominal pain.  Patient has been experiencing intermittent right upper and upper central abdominal pain for the last 3 weeks.  Patient has right upper quadrant moderate tenderness, +/- Murphy sign on examination.  She does have an elevated white blood cell count, lipase and LFTs are normal.  Ultrasound does show multiple stones in the gallbladder, no pericholecystic fluid, no clear signs of gallbladder wall thickening, however may be obscured secondary to the number of stones.  She does have  a Murphy sign on ultrasound.  Patient administered IV Dilaudid with only minimal improvement of pain.  Patient therefore discussed with Dr. Carolynne Edouard, on-call for general surgery.  Patient will be seen by surgery in the ER for admission and cholecystectomy.  Final Clinical Impressions(s) / ED Diagnoses   Final diagnoses:  Cholecystitis    ED Discharge Orders    None       Carmell Elgin, Canary Brim, MD 08/01/18 0200

## 2018-07-31 NOTE — ED Notes (Signed)
Ultrasound at bedside

## 2018-07-31 NOTE — Discharge Instructions (Signed)
I would recommend a low fat diet as I am concerned about gall stones contributing to your symptoms.  We will call you tonight if any abnormal/concerning lab findings.  You likely need an ultrasound to evaluate the gallbladder. Please establish with a primary care provider and/or with gastroenterology for follow up.  Omeprazole daily, sucralfate with meals and bedtimes. Zofran as needed for nausea.  If develop worsening of pain, fevers, dehydration or otherwise worsening please go to the ER for further evaluation.

## 2018-07-31 NOTE — ED Triage Notes (Signed)
Pt presents with abdominal pain that is mostly on the right side and radiates around to her back.

## 2018-07-31 NOTE — ED Provider Notes (Signed)
[facility]    CSN: [id number] Arrival date & time: 07/31/18  1651     History   Chief Complaint Chief Complaint  Patient presents with  . Appointment  . (5;00) Abddominal Pain    HPI Glenda Merritt is a 27 y.o. female.   Glenda Merritt presents with complaints of upper abdominal pain, epigastric and RUQ which radiates around to right posterior shoulder. Woke with it this morning with increased intensity. Has been dealing with similar since January, became worse in June. Has gone to the ER with normal evaluation. Has not had imaging completed in the past. Does not have a PCP and hasn't followed with GI. Has tried taking pepto-bismol which has not helped. Pain 10/10. Can be worse worse movement. Nausea, no vomiting. Decreased appetite. Last meal was bacon and eggs yesterday evening. Normal BM's, last this afternoon. Has been drinking some fluids. No fevers. No urinary complaints. No previous abdominal history. Hx of asthma, eczema. Still has gallbladder and appendix.     ROS per HPI.      Past Medical History:  Diagnosis Date  . Asthma   . Eczema     There are no active problems to display for this patient.   Past Surgical History:  Procedure Laterality Date  . TUBAL LIGATION      OB History   None      Home Medications    Prior to Admission medications   Medication Sig Start Date End Date Taking? Authorizing Provider  omeprazole (PRILOSEC) 20 MG capsule Take 1 capsule (20 mg total) by mouth daily. 07/31/18   Georgetta Haber, NP  ondansetron (ZOFRAN ODT) 4 MG disintegrating tablet Take 1 tablet (4 mg total) by mouth every 8 (eight) hours as needed for nausea or vomiting. 07/31/18   Georgetta Haber, NP  sucralfate (CARAFATE) 1 GM/10ML suspension Take 10 mLs (1 g total) by mouth 4 (four) times daily -  with meals and at bedtime. 07/31/18   Georgetta Haber, NP    Family History History reviewed. No pertinent family history.  Social History Social  History   Tobacco Use  . Smoking status: Never Smoker  . Smokeless tobacco: Never Used  Substance Use Topics  . Alcohol use: Yes  . Drug use: Yes    Types: Marijuana     Allergies   Shellfish allergy; Lactose intolerance (gi); and Latex   Review of Systems Review of Systems   Physical Exam Triage Vital Signs ED Triage Vitals  Enc Vitals Group     BP 07/31/18 1704 (!) 141/96     Pulse Rate 07/31/18 1704 92     Resp 07/31/18 1704 20     Temp 07/31/18 1704 98.3 F (36.8 C)     Temp Source 07/31/18 1704 Oral     SpO2 07/31/18 1704 98 %     Weight --      Height --      Head Circumference --      Peak Flow --      Pain Score 07/31/18 1705 10     Pain Loc --      Pain Edu? --      Excl. in GC? --    No data found.  Updated Vital Signs BP (!) 141/96 (BP Location: Right Arm)   Pulse 92   Temp 98.3 F (36.8 C) (Oral)   Resp 20   LMP 07/20/2018   SpO2 98%    Physical Exam  Constitutional: She is  oriented to person, place, and time. She appears well-developed and well-nourished. No distress.  Cardiovascular: Normal rate, regular rhythm and normal heart sounds.  Pulmonary/Chest: Effort normal and breath sounds normal.  Abdominal: Soft. Bowel sounds are normal. There is no hepatosplenomegaly, splenomegaly or hepatomegaly. There is tenderness in the right upper quadrant and epigastric area. There is no rigidity, no rebound, no guarding and no CVA tenderness. No hernia.  Quite significant RUQ and epigastric pain on palpation; no rebound tenderness   Neurological: She is alert and oriented to person, place, and time.  Skin: Skin is warm and dry.     UC Treatments / Results  Labs (all labs ordered are listed, but only abnormal results are displayed) Labs Reviewed  CBC WITH DIFFERENTIAL/PLATELET  COMPREHENSIVE METABOLIC PANEL  LIPASE, BLOOD    EKG None  Radiology No results found.  Procedures Procedures (including critical care time)  Medications Ordered  in UC Medications  ondansetron (ZOFRAN-ODT) disintegrating tablet 4 mg (has no administration in time range)  alum & mag hydroxide-simeth (MAALOX/MYLANTA) 200-200-20 MG/5ML suspension 30 mL (has no administration in time range)    And  lidocaine (XYLOCAINE) 2 % viscous mouth solution 15 mL (has no administration in time range)    Initial Impression / Assessment and Plan / UC Course  I have reviewed the triage vital signs and the nursing notes.  Pertinent labs & imaging results that were available during my care of the patient were reviewed by me and considered in my medical decision making (see chart for details).     Afebrile. No vomiting or diarrhea. Has been ongoing since January. Worse after eating bacon and eggs. Concern for gallstones. Discussed that likely needs abdominal ultrasound, patient states she would not like to got to ER now. CBC, CMP and lipase pending. Will notify patient of abnormal/concerning results which require a more urgent abdominal evaluation. Gi cocktail and zofran provided in clinic. Omeprazole, zofran and carafate prescribed. encouraged establish with PCP for management as likely will nee further evaluation of this. Return precautions provided. Patient verbalized understanding and agreeable to plan.   Final Clinical Impressions(s) / UC Diagnoses   Final diagnoses:  Abdominal pain, right upper quadrant  Abdominal pain, epigastric     Discharge Instructions     I would recommend a low fat diet as I am concerned about gall stones contributing to your symptoms.  We will call you tonight if any abnormal/concerning lab findings.  You likely need an ultrasound to evaluate the gallbladder. Please establish with a primary care provider and/or with gastroenterology for follow up.  Omeprazole daily, sucralfate with meals and bedtimes. Zofran as needed for nausea.  If develop worsening of pain, fevers, dehydration or otherwise worsening please go to the ER for further  evaluation.    ED Prescriptions    Medication Sig Dispense Auth. Provider   ondansetron (ZOFRAN ODT) 4 MG disintegrating tablet Take 1 tablet (4 mg total) by mouth every 8 (eight) hours as needed for nausea or vomiting. 15 tablet Linus Mako B, NP   sucralfate (CARAFATE) 1 GM/10ML suspension Take 10 mLs (1 g total) by mouth 4 (four) times daily -  with meals and at bedtime. 420 mL Linus Mako B, NP   omeprazole (PRILOSEC) 20 MG capsule Take 1 capsule (20 mg total) by mouth daily. 30 capsule Georgetta Haber, NP     Controlled Substance Prescriptions [place] Controlled Substance Registry consulted? Not Applicable   Georgetta Haber, NP 07/31/18 1731

## 2018-07-31 NOTE — ED Notes (Signed)
Bed: UC01 Expected date:  Expected time:  Means of arrival:  Comments: Appointments 

## 2018-08-01 ENCOUNTER — Emergency Department (HOSPITAL_COMMUNITY): Payer: Self-pay | Admitting: Anesthesiology

## 2018-08-01 ENCOUNTER — Encounter (HOSPITAL_COMMUNITY): Payer: Self-pay

## 2018-08-01 ENCOUNTER — Encounter (HOSPITAL_COMMUNITY): Admission: EM | Disposition: A | Discharge status: Home / Self Care | Payer: Self-pay | Source: Home / Self Care

## 2018-08-01 ENCOUNTER — Other Ambulatory Visit: Payer: Self-pay

## 2018-08-01 DIAGNOSIS — Z9049 Acquired absence of other specified parts of digestive tract: Secondary | ICD-10-CM

## 2018-08-01 DIAGNOSIS — K8020 Calculus of gallbladder without cholecystitis without obstruction: Secondary | ICD-10-CM | POA: Diagnosis present

## 2018-08-01 HISTORY — PX: CHOLECYSTECTOMY: SHX55

## 2018-08-01 SURGERY — LAPAROSCOPIC CHOLECYSTECTOMY
Anesthesia: General | Site: Abdomen

## 2018-08-01 MED ORDER — LACTATED RINGERS IV SOLN
INTRAVENOUS | Status: DC
  Administered 2018-08-01 (×2): via INTRAVENOUS

## 2018-08-01 MED ORDER — ONDANSETRON 4 MG PO TBDP: 4.0000 mg | ORAL_TABLET | Freq: Four times a day (QID) | ORAL | Status: DC | PRN

## 2018-08-01 MED ORDER — DEXAMETHASONE SODIUM PHOSPHATE 10 MG/ML IJ SOLN
INTRAMUSCULAR | Status: DC | PRN
  Administered 2018-08-01: 10 mg via INTRAVENOUS

## 2018-08-01 MED ORDER — ONDANSETRON HCL 4 MG/2ML IJ SOLN
INTRAMUSCULAR | Status: DC | PRN
  Administered 2018-08-01: 4 mg via INTRAVENOUS

## 2018-08-01 MED ORDER — BUPIVACAINE HCL (PF) 0.25 % IJ SOLN
INTRAMUSCULAR | Status: AC
  Filled 2018-08-01: qty 30

## 2018-08-01 MED ORDER — LACTATED RINGERS IV SOLN
INTRAVENOUS | Status: AC | PRN
  Administered 2018-08-01: 1000 mL

## 2018-08-01 MED ORDER — ALBUTEROL SULFATE HFA 108 (90 BASE) MCG/ACT IN AERS
INHALATION_SPRAY | RESPIRATORY_TRACT | Status: AC
  Filled 2018-08-01: qty 6.7

## 2018-08-01 MED ORDER — ACETAMINOPHEN 325 MG PO TABS
650.0000 mg | ORAL_TABLET | Freq: Four times a day (QID) | ORAL | Status: DC
  Administered 2018-08-01 – 2018-08-02 (×3): 650 mg via ORAL
  Filled 2018-08-01 (×3): qty 2

## 2018-08-01 MED ORDER — FENTANYL CITRATE (PF) 100 MCG/2ML IJ SOLN
INTRAMUSCULAR | Status: AC
  Filled 2018-08-01: qty 4

## 2018-08-01 MED ORDER — ROCURONIUM BROMIDE 10 MG/ML (PF) SYRINGE
PREFILLED_SYRINGE | INTRAVENOUS | Status: AC
  Filled 2018-08-01: qty 10

## 2018-08-01 MED ORDER — HYDROMORPHONE HCL 1 MG/ML IJ SOLN
0.5000 mg | INTRAMUSCULAR | Status: DC | PRN
  Administered 2018-08-01: 0.5 mg via INTRAVENOUS
  Filled 2018-08-01: qty 0.5

## 2018-08-01 MED ORDER — SUCCINYLCHOLINE CHLORIDE 200 MG/10ML IV SOSY
PREFILLED_SYRINGE | INTRAVENOUS | Status: AC
  Filled 2018-08-01: qty 10

## 2018-08-01 MED ORDER — DEXAMETHASONE SODIUM PHOSPHATE 10 MG/ML IJ SOLN
INTRAMUSCULAR | Status: AC
  Filled 2018-08-01: qty 1

## 2018-08-01 MED ORDER — LIDOCAINE 2% (20 MG/ML) 5 ML SYRINGE
INTRAMUSCULAR | Status: DC | PRN
  Administered 2018-08-01: 100 mg via INTRAVENOUS

## 2018-08-01 MED ORDER — FENTANYL CITRATE (PF) 100 MCG/2ML IJ SOLN
25.0000 ug | INTRAMUSCULAR | Status: DC | PRN
  Administered 2018-08-01 (×3): 50 ug via INTRAVENOUS

## 2018-08-01 MED ORDER — 0.9 % SODIUM CHLORIDE (POUR BTL) OPTIME
TOPICAL | Status: DC | PRN
  Administered 2018-08-01: 1000 mL

## 2018-08-01 MED ORDER — MIDAZOLAM HCL 2 MG/2ML IJ SOLN
INTRAMUSCULAR | Status: AC
  Filled 2018-08-01: qty 2

## 2018-08-01 MED ORDER — MIDAZOLAM HCL 2 MG/2ML IJ SOLN
INTRAMUSCULAR | Status: DC | PRN
  Administered 2018-08-01: 2 mg via INTRAVENOUS

## 2018-08-01 MED ORDER — HYDROMORPHONE HCL 1 MG/ML IJ SOLN
1.0000 mg | Freq: Once | INTRAMUSCULAR | Status: AC
  Administered 2018-08-01: 1 mg via INTRAVENOUS
  Filled 2018-08-01: qty 1

## 2018-08-01 MED ORDER — LABETALOL HCL 5 MG/ML IV SOLN
INTRAVENOUS | Status: DC | PRN
  Administered 2018-08-01 (×4): 5 mg via INTRAVENOUS

## 2018-08-01 MED ORDER — HEPARIN SODIUM (PORCINE) 5000 UNIT/ML IJ SOLN
5000.0000 [IU] | Freq: Three times a day (TID) | INTRAMUSCULAR | Status: DC
  Administered 2018-08-01 – 2018-08-02 (×2): 5000 [IU] via SUBCUTANEOUS
  Filled 2018-08-01 (×2): qty 1

## 2018-08-01 MED ORDER — PROPOFOL 10 MG/ML IV BOLUS
INTRAVENOUS | Status: DC | PRN
  Administered 2018-08-01: 170 mg via INTRAVENOUS

## 2018-08-01 MED ORDER — ONDANSETRON HCL 4 MG/2ML IJ SOLN
4.0000 mg | Freq: Four times a day (QID) | INTRAMUSCULAR | Status: DC | PRN
  Administered 2018-08-01: 4 mg via INTRAVENOUS
  Filled 2018-08-01: qty 2

## 2018-08-01 MED ORDER — ROCURONIUM BROMIDE 10 MG/ML (PF) SYRINGE
PREFILLED_SYRINGE | INTRAVENOUS | Status: DC | PRN
  Administered 2018-08-01: 30 mg via INTRAVENOUS
  Administered 2018-08-01 (×3): 10 mg via INTRAVENOUS

## 2018-08-01 MED ORDER — SUCCINYLCHOLINE CHLORIDE 200 MG/10ML IV SOSY
PREFILLED_SYRINGE | INTRAVENOUS | Status: DC | PRN
  Administered 2018-08-01: 120 mg via INTRAVENOUS

## 2018-08-01 MED ORDER — ALBUTEROL SULFATE HFA 108 (90 BASE) MCG/ACT IN AERS
INHALATION_SPRAY | RESPIRATORY_TRACT | Status: DC | PRN
  Administered 2018-08-01: 7 via RESPIRATORY_TRACT

## 2018-08-01 MED ORDER — FENTANYL CITRATE (PF) 250 MCG/5ML IJ SOLN
INTRAMUSCULAR | Status: AC
  Filled 2018-08-01: qty 5

## 2018-08-01 MED ORDER — LABETALOL HCL 5 MG/ML IV SOLN
INTRAVENOUS | Status: AC
  Filled 2018-08-01: qty 4

## 2018-08-01 MED ORDER — SUGAMMADEX SODIUM 500 MG/5ML IV SOLN
INTRAVENOUS | Status: AC
  Filled 2018-08-01: qty 5

## 2018-08-01 MED ORDER — METOCLOPRAMIDE HCL 5 MG/ML IJ SOLN
10.0000 mg | Freq: Once | INTRAMUSCULAR | Status: AC
  Administered 2018-08-01: 10 mg via INTRAVENOUS
  Filled 2018-08-01: qty 2

## 2018-08-01 MED ORDER — ONDANSETRON HCL 4 MG/2ML IJ SOLN
4.0000 mg | Freq: Once | INTRAMUSCULAR | Status: AC
  Administered 2018-08-01: 4 mg via INTRAVENOUS
  Filled 2018-08-01: qty 2

## 2018-08-01 MED ORDER — SODIUM CHLORIDE 0.9 % IV SOLN
2.0000 g | Freq: Once | INTRAVENOUS | Status: AC
  Administered 2018-08-01: 2 g via INTRAVENOUS
  Filled 2018-08-01: qty 20

## 2018-08-01 MED ORDER — SUGAMMADEX SODIUM 200 MG/2ML IV SOLN
INTRAVENOUS | Status: DC | PRN
  Administered 2018-08-01: 300 mg via INTRAVENOUS

## 2018-08-01 MED ORDER — LIDOCAINE 2% (20 MG/ML) 5 ML SYRINGE
INTRAMUSCULAR | Status: AC
  Filled 2018-08-01: qty 5

## 2018-08-01 MED ORDER — TRAMADOL HCL 50 MG PO TABS
50.0000 mg | ORAL_TABLET | Freq: Four times a day (QID) | ORAL | Status: DC | PRN
  Administered 2018-08-01: 50 mg via ORAL
  Filled 2018-08-01: qty 1

## 2018-08-01 MED ORDER — BUPIVACAINE HCL 0.25 % IJ SOLN
INTRAMUSCULAR | Status: DC | PRN
  Administered 2018-08-01: 20 mL

## 2018-08-01 MED ORDER — ONDANSETRON HCL 4 MG/2ML IJ SOLN
INTRAMUSCULAR | Status: AC
  Filled 2018-08-01: qty 2

## 2018-08-01 MED ORDER — ONDANSETRON HCL 4 MG/2ML IJ SOLN: 4.0000 mg | Freq: Once | INTRAMUSCULAR | Status: DC | PRN

## 2018-08-01 MED ORDER — LACTATED RINGERS IV SOLN
INTRAVENOUS | Status: DC
  Administered 2018-08-01 – 2018-08-02 (×2): via INTRAVENOUS

## 2018-08-01 MED ORDER — PROPOFOL 10 MG/ML IV BOLUS
INTRAVENOUS | Status: AC
  Filled 2018-08-01: qty 20

## 2018-08-01 MED ORDER — FENTANYL CITRATE (PF) 250 MCG/5ML IJ SOLN
INTRAMUSCULAR | Status: DC | PRN
  Administered 2018-08-01 (×4): 50 ug via INTRAVENOUS
  Administered 2018-08-01: 150 ug via INTRAVENOUS

## 2018-08-01 MED ORDER — IOPAMIDOL (ISOVUE-300) INJECTION 61%
INTRAVENOUS | Status: AC
  Filled 2018-08-01: qty 50

## 2018-08-01 MED ORDER — HYDROMORPHONE HCL 1 MG/ML IJ SOLN
1.0000 mg | INTRAMUSCULAR | Status: DC | PRN
  Administered 2018-08-01: 1 mg via INTRAVENOUS
  Filled 2018-08-01: qty 1

## 2018-08-01 MED ORDER — IBUPROFEN 200 MG PO TABS
600.0000 mg | ORAL_TABLET | Freq: Four times a day (QID) | ORAL | Status: DC
  Administered 2018-08-01 – 2018-08-02 (×3): 600 mg via ORAL
  Filled 2018-08-01 (×3): qty 3

## 2018-08-01 SURGICAL SUPPLY — 40 items
APPLICATOR ARISTA FLEXITIP XL (MISCELLANEOUS) IMPLANT
APPLIER CLIP 5 13 M/L LIGAMAX5 (MISCELLANEOUS) ×2
APPLIER CLIP ROT 10 11.4 M/L (STAPLE)
CABLE HIGH FREQUENCY MONO STRZ (ELECTRODE) ×2 IMPLANT
CHLORAPREP W/TINT 26ML (MISCELLANEOUS) ×2 IMPLANT
CLIP APPLIE 5 13 M/L LIGAMAX5 (MISCELLANEOUS) ×1 IMPLANT
CLIP APPLIE ROT 10 11.4 M/L (STAPLE) IMPLANT
COVER MAYO STAND STRL (DRAPES) IMPLANT
COVER SURGICAL LIGHT HANDLE (MISCELLANEOUS) ×2 IMPLANT
COVER WAND RF STERILE (DRAPES) IMPLANT
DECANTER SPIKE VIAL GLASS SM (MISCELLANEOUS) ×2 IMPLANT
DERMABOND ADVANCED (GAUZE/BANDAGES/DRESSINGS) ×1
DERMABOND ADVANCED .7 DNX12 (GAUZE/BANDAGES/DRESSINGS) ×1 IMPLANT
DISSECTOR BLUNT TIP ENDO 5MM (MISCELLANEOUS) IMPLANT
DRAPE C-ARM 42X120 X-RAY (DRAPES) IMPLANT
ELECT PENCIL ROCKER SW 15FT (MISCELLANEOUS) ×2 IMPLANT
ELECT REM PT RETURN 15FT ADLT (MISCELLANEOUS) ×2 IMPLANT
GLOVE BIO SURGEON STRL SZ7.5 (GLOVE) ×2 IMPLANT
GLOVE INDICATOR 8.0 STRL GRN (GLOVE) ×2 IMPLANT
GOWN STRL REUS W/TWL XL LVL3 (GOWN DISPOSABLE) ×4 IMPLANT
GRASPER SUT TROCAR 14GX15 (MISCELLANEOUS) IMPLANT
HEMOSTAT ARISTA ABSORB 3G PWDR (MISCELLANEOUS) IMPLANT
HEMOSTAT SNOW SURGICEL 2X4 (HEMOSTASIS) IMPLANT
KIT BASIN OR (CUSTOM PROCEDURE TRAY) ×2 IMPLANT
NEEDLE INSUFFLATION 14GA 120MM (NEEDLE) IMPLANT
POUCH SPECIMEN RETRIEVAL 10MM (ENDOMECHANICALS) ×2 IMPLANT
SCISSORS LAP 5X35 DISP (ENDOMECHANICALS) ×2 IMPLANT
SET CHOLANGIOGRAPH MIX (MISCELLANEOUS) IMPLANT
SET IRRIG TUBING LAPAROSCOPIC (IRRIGATION / IRRIGATOR) ×2 IMPLANT
SLEEVE ADV FIXATION 5X100MM (TROCAR) ×4 IMPLANT
SUT MNCRL AB 4-0 PS2 18 (SUTURE) ×2 IMPLANT
SYR 10ML ECCENTRIC (SYRINGE) ×2 IMPLANT
TOWEL OR 17X26 10 PK STRL BLUE (TOWEL DISPOSABLE) ×2 IMPLANT
TOWEL OR NON WOVEN STRL DISP B (DISPOSABLE) IMPLANT
TRAY LAPAROSCOPIC (CUSTOM PROCEDURE TRAY) ×2 IMPLANT
TROCAR ADV FIXATION 12X100MM (TROCAR) IMPLANT
TROCAR ADV FIXATION 5X100MM (TROCAR) ×2 IMPLANT
TROCAR XCEL BLUNT TIP 100MML (ENDOMECHANICALS) ×2 IMPLANT
TROCAR XCEL NON-BLD 11X100MML (ENDOMECHANICALS) IMPLANT
TUBING INSUF HEATED (TUBING) ×2 IMPLANT

## 2018-08-01 NOTE — Anesthesia Procedure Notes (Signed)
Procedure Name: Intubation Date/Time: 08/01/2018 1:03 PM Performed by: Florene Route, CRNA Patient Re-evaluated:Patient Re-evaluated prior to induction Oxygen Delivery Method: Circle system utilized Preoxygenation: Pre-oxygenation with 100% oxygen Induction Type: IV induction Ventilation: Oral airway inserted - appropriate to patient size Laryngoscope Size: Hyacinth Meeker and 2 Grade View: Grade I Tube type: Oral Tube size: 7.5 mm Number of attempts: 1 Airway Equipment and Method: Stylet Placement Confirmation: ETT inserted through vocal cords under direct vision,  positive ETCO2 and breath sounds checked- equal and bilateral Secured at: 21 cm Tube secured with: Tape Dental Injury: Teeth and Oropharynx as per pre-operative assessment

## 2018-08-01 NOTE — Anesthesia Preprocedure Evaluation (Signed)
Anesthesia Evaluation  Patient identified by MRN, date of birth, ID band Patient awake    Reviewed: Allergy & Precautions, NPO status , Patient's Chart, lab work & pertinent test results  Airway Mallampati: II  TM Distance: >3 FB Neck ROM: Full    Dental  (+) Dental Advisory Given   Pulmonary asthma ,    breath sounds clear to auscultation       Cardiovascular negative cardio ROS   Rhythm:Regular Rate:Normal     Neuro/Psych negative neurological ROS     GI/Hepatic Neg liver ROS,   Endo/Other  Morbid obesity  Renal/GU negative Renal ROS     Musculoskeletal   Abdominal   Peds  Hematology negative hematology ROS (+)   Anesthesia Other Findings   Reproductive/Obstetrics                             Lab Results  Component Value Date   WBC 6.8 07/31/2018   HGB 12.5 07/31/2018   HCT 40.4 07/31/2018   MCV 85.1 07/31/2018   PLT 296 07/31/2018   Lab Results  Component Value Date   CREATININE 0.96 07/31/2018   BUN 9 07/31/2018   NA 138 07/31/2018   K 3.6 07/31/2018   CL 103 07/31/2018   CO2 26 07/31/2018    Anesthesia Physical Anesthesia Plan  ASA: III  Anesthesia Plan: General   Post-op Pain Management:    Induction: Intravenous  PONV Risk Score and Plan: 3 and Ondansetron, Dexamethasone, Midazolam and Treatment may vary due to age or medical condition  Airway Management Planned: Oral ETT  Additional Equipment:   Intra-op Plan:   Post-operative Plan: Extubation in OR  Informed Consent: I have reviewed the patients History and Physical, chart, labs and discussed the procedure including the risks, benefits and alternatives for the proposed anesthesia with the patient or authorized representative who has indicated his/her understanding and acceptance.   Dental advisory given  Plan Discussed with: CRNA  Anesthesia Plan Comments:         Anesthesia Quick  Evaluation

## 2018-08-01 NOTE — Discharge Instructions (Signed)
[facility] ° °Please arrive at least 30 min before your appointment to complete your check in paperwork.  If you are unable to arrive 30 min prior to your appointment time we may have to cancel or reschedule you. °LAPAROSCOPIC SURGERY: POST OP INSTRUCTIONS °Always review your discharge instruction sheet given to you by the facility where your surgery was performed. °IF YOU HAVE DISABILITY OR FAMILY LEAVE FORMS, YOU MUST BRING THEM TO THE OFFICE FOR PROCESSING.   °DO NOT GIVE THEM TO YOUR DOCTOR. ° °PAIN CONTROL ° °1. First take acetaminophen (Tylenol) AND/or ibuprofen (Advil) to control your pain after surgery.  Follow directions on package.  Taking acetaminophen (Tylenol) and/or ibuprofen (Advil) regularly after surgery will help to control your pain and lower the amount of prescription pain medication you may need.  You should not take more than 4,000 mg (4 grams) of acetaminophen (Tylenol) in 24 hours.  You should not take ibuprofen (Advil), aleve, motrin, naprosyn or other NSAIDS if you have a history of stomach ulcers or chronic kidney disease.  °2. A prescription for pain medication may be given to you upon discharge.  Take your pain medication as prescribed, if you still have uncontrolled pain after taking acetaminophen (Tylenol) or ibuprofen (Advil). °3. Use ice packs to help control pain. °4. If you need a refill on your pain medication, please contact your pharmacy.  They will contact our office to request authorization. Prescriptions will not be filled after 5pm or on week-ends. ° °HOME MEDICATIONS °5. Take your usually prescribed medications unless otherwise directed. ° °DIET °6. You should follow a light diet the first few days after arrival home.  Be sure to include lots of fluids daily. Avoid fatty, fried foods.  ° °CONSTIPATION °7. It is common to experience some constipation after surgery and if you are taking pain medication.  Increasing fluid intake and taking a stool  softener (such as Colace) will usually help or prevent this problem from occurring.  A mild laxative (Milk of Magnesia or Miralax) should be taken according to package instructions if there are no bowel movements after 48 hours. ° °WOUND/INCISION CARE °8. Most patients will experience some swelling and bruising in the area of the incisions.  Ice packs will help.  Swelling and bruising can take several days to resolve.  °9. Unless discharge instructions indicate otherwise, follow guidelines below  °a. STERI-STRIPS - you may remove your outer bandages 48 hours after surgery, and you may shower at that time.  You have steri-strips (small skin tapes) in place directly over the incision.  These strips should be left on the skin for 7-10 days.   °b. DERMABOND/SKIN GLUE - you may shower in 24 hours.  The glue will flake off over the next 2-3 weeks. °10. Any sutures or staples will be removed at the office during your follow-up visit. ° °ACTIVITIES °11. You may resume regular (light) daily activities beginning the next day--such as daily self-care, walking, climbing stairs--gradually increasing activities as tolerated.  You may have sexual intercourse when it is comfortable.  Refrain from any heavy lifting or straining until approved by your doctor. °a. You may drive when you are no longer taking prescription pain medication, you can comfortably wear a seatbelt, and you can safely maneuver your car and apply brakes. ° °FOLLOW-UP °12. You should see your doctor in the office for a follow-up appointment approximately 2-3 weeks after your surgery.  You should have been given your post-op/follow-up appointment when   your surgery was scheduled.  If you did not receive a post-op/follow-up appointment, make sure that you call for this appointment within a day or two after you arrive home to insure a convenient appointment time. ° °OTHER INSTRUCTIONS ° °WHEN TO CALL YOUR DOCTOR: °1. Fever over 101.0 °2. Inability to  urinate °3. Continued bleeding from incision. °4. Increased pain, redness, or drainage from the incision. °5. Increasing abdominal pain ° °The clinic staff is available to answer your questions during regular business hours.  Please don’t hesitate to call and ask to speak to one of the nurses for clinical concerns.  If you have a medical emergency, go to the nearest emergency room or call 911.  A surgeon from [facility] is always on call at the hospital. °[address], [address], [place], [place]  [address] ? [address] [address], [place],    [address] °[phone] ? [phone] ? FAX [phone] ° ° ° °

## 2018-08-01 NOTE — ED Notes (Signed)
Ice chips given to pt.  

## 2018-08-01 NOTE — H&P (Signed)
Glenda Merritt 1991/02/07  [id number].    Requesting MD: Joseph Berkshire Chief Complaint/Reason for Consult: gallstones  HPI:  Glenda Merritt is a 27yo female who presented to [facility] with worsening right sided abdominal pain. States that she has had similar, intermittent pains since June of this year. This episode started about 3 weeks ago, but got much worse 2 days ago. Pain is RUQ and radiating in her back. Associated with nausea and diarrhea. Denies emesis, fever, chills. Worse with PO intake. Pepto Bismal did not help so she went to the urgent care who referred her to the ED. In the ED she was found to have a normal WBC, normal LFTs, and normal lipase. Abdominal u/s revealed a gallbladder filled with gallstone, no signs of acute cholecystitis but she did have a positive Murphy sign. General surgery asked to see.  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   HENT: Negative.   Eyes: Negative.   Respiratory: Negative.   Cardiovascular: Negative.   Gastrointestinal: Positive for abdominal pain, diarrhea and nausea. Negative for vomiting.  Genitourinary: Negative.   Musculoskeletal: Positive for back pain.  Skin: Negative.   Neurological: Negative.     All systems reviewed and otherwise negative except for as above  No family history on file.  Past Medical History:  Diagnosis Date  . Asthma   . Eczema     Past Surgical History:  Procedure Laterality Date  . TUBAL LIGATION      Social History:  reports that she has never smoked. She has never used smokeless tobacco. She reports that she drinks alcohol. She reports that she has current or past drug history. Drug: Marijuana.  Allergies:  Allergies  Allergen Reactions  .  Shellfish Allergy Anaphylaxis  . Lactose Intolerance (Gi) Other (See Comments)    GI upset  . Latex Rash     (Not in a hospital admission)  Prior to Admission medications   Medication Sig Start Date End Date Taking? Authorizing Provider  omeprazole (PRILOSEC) 20 MG capsule Take 1 capsule (20 mg total) by mouth daily. 07/31/18   Zigmund Gottron, NP  ondansetron (ZOFRAN ODT) 4 MG disintegrating tablet Take 1 tablet (4 mg total) by mouth every 8 (eight) hours as needed for nausea or vomiting. 07/31/18   Zigmund Gottron, NP  sucralfate (CARAFATE) 1 GM/10ML suspension Take 10 mLs (1 g total) by mouth 4 (four) times daily -  with meals and at bedtime. 07/31/18   Augusto Gamble B, NP    Blood pressure 130/75, pulse 61, temperature 97.6 F (36.4 C), temperature source Oral, resp. rate 16, last menstrual period 07/20/2018, SpO2 99 %. Physical Exam: General: pleasant, WD/WN AA female who is laying in bed in NAD HEENT: head is normocephalic, atraumatic.  Sclera are noninjected.  Pupils equal and round.  Ears and nose without any masses or lesions.  Mouth is pink and moist. Dentition fair Heart: regular, rate, and rhythm.  No obvious murmurs, gallops, or rubs noted.  Palpable pedal pulses bilaterally Lungs: CTAB, no wheezes, rhonchi, or rales noted.  Respiratory effort nonlabored Abd: soft, ND, +BS, no masses, hernias, or organomegaly. +TTP RUQ and epigastric region without rebound or guarding MS: all 4 extremities are symmetrical with no cyanosis, clubbing, or edema. Skin: warm and dry with no  masses, lesions, or rashes Psych: A&Ox3 with an appropriate affect. Neuro: cranial nerves grossly intact, extremity CSM intact bilaterally, normal speech  Results for orders placed or performed during the hospital encounter of 07/31/18 (from the past 48 hour(s))  I-Stat beta hCG blood, ED     Status: None   Collection Time: 07/31/18  6:40 PM  Result Value Ref Range   I-stat hCG, quantitative <5.0 <5  mIU/mL   Comment 3            Comment:   GEST. AGE      CONC.  (mIU/mL)   <=1 WEEK        5 - 50     2 WEEKS       50 - 500     3 WEEKS       100 - 10,000     4 WEEKS     1,000 - 30,000        FEMALE AND NON-PREGNANT FEMALE:     LESS THAN 5 mIU/mL   Lipase, blood     Status: None   Collection Time: 07/31/18  6:44 PM  Result Value Ref Range   Lipase 22 11 - 51 U/L    Comment: Performed at [facility], [place] [address] [place], [place] [address]  Comprehensive metabolic panel     Status: None   Collection Time: 07/31/18  6:44 PM  Result Value Ref Range   Sodium 138 135 - 145 mmol/L   Potassium 3.6 3.5 - 5.1 mmol/L   Chloride 103 98 - 111 mmol/L   CO2 26 22 - 32 mmol/L   Glucose, Bld 97 70 - 99 mg/dL   BUN 9 6 - 20 mg/dL   Creatinine, Ser 0.96 0.44 - 1.00 mg/dL   Calcium 9.7 8.9 - 10.3 mg/dL   Total Protein 8.0 6.5 - 8.1 g/dL   Albumin 4.2 3.5 - 5.0 g/dL   AST 18 15 - 41 U/L   ALT 17 0 - 44 U/L   Alkaline Phosphatase 52 38 - 126 U/L   Total Bilirubin 0.4 0.3 - 1.2 mg/dL   GFR calc non Af Amer >60 >60 mL/min   GFR calc Af Amer >60 >60 mL/min    Comment: (NOTE) The eGFR has been calculated using the CKD EPI equation. This calculation has not been validated in all clinical situations. eGFR's persistently <60 mL/min signify possible Chronic Kidney Disease.    Anion gap 9 5 - 15    Comment: Performed at [facility], [place] [address] [place], [place] [address]  CBC     Status: None   Collection Time: 07/31/18  6:44 PM  Result Value Ref Range   WBC 6.8 4.0 - 10.5 K/uL   RBC 4.75 3.87 - 5.11 MIL/uL   Hemoglobin 12.5 12.0 - 15.0 g/dL   HCT 40.4 36.0 - 46.0 %   MCV 85.1 80.0 - 100.0 fL   MCH 26.3 26.0 - 34.0 pg   MCHC 30.9 30.0 - 36.0 g/dL   RDW 13.5 11.5 - 15.5 %   Platelets 296 150 - 400 K/uL   nRBC 0.0 0.0 - 0.2 %    Comment: Performed at [facility], [place] [address] [place], [place] [address]  Urinalysis, Routine  w reflex microscopic     Status: Abnormal   Collection Time: 07/31/18  8:42 PM  Result Value Ref Range   Color, Urine YELLOW YELLOW   APPearance CLEAR CLEAR  Specific Gravity, Urine 1.025 1.005 - 1.030   pH 5.0 5.0 - 8.0   Glucose, UA NEGATIVE NEGATIVE mg/dL   Hgb urine dipstick NEGATIVE NEGATIVE   Bilirubin Urine NEGATIVE NEGATIVE   Ketones, ur NEGATIVE NEGATIVE mg/dL   Protein, ur 100 (A) NEGATIVE mg/dL   Nitrite NEGATIVE NEGATIVE   Leukocytes, UA NEGATIVE NEGATIVE   RBC / HPF 0-5 0 - 5 RBC/hpf   WBC, UA 0-5 0 - 5 WBC/hpf   Bacteria, UA NONE SEEN NONE SEEN   Squamous Epithelial / LPF 0-5 0 - 5   Mucus PRESENT    Hyaline Casts, UA PRESENT     Comment: Performed at [facility], [place] [address], [place], [place] [address]   US Abdomen Limited Ruq  Result Date: 08/01/2018 CLINICAL DATA:  Right upper quadrant pain for 4 months. EXAM: ULTRASOUND ABDOMEN LIMITED RIGHT UPPER QUADRANT COMPARISON:  None. FINDINGS: Gallbladder: The gallbladder is filled with stones creating a wall echo shadow complex with shadowing obscuring the posterior wall. No definite wall thickening, gallbladder wall thickness is 2 mm, but partially obscured due to adjacent gallstones. No pericholecystic fluid. Positive sonographic Murphy sign noted by sonographer. Common bile duct: Diameter: 4-5 mm, normal. Liver: No focal lesion identified. Slightly increased in parenchymal echogenicity compared to right kidney. Portal vein is patent on color Doppler imaging with normal direction of blood flow towards the liver. IMPRESSION: 1. Gallbladder filled with gallstones creating a wall echo shadow complex. While there is no pericholecystic fluid or wall thickening, technologist notes a positive sonographic Murphy sign which is suspicious for acute cholecystitis. 2. No biliary dilatation. Electronically Signed   By: Keith Rake M.D.   On: 08/01/2018 00:17   Anti-infectives (From admission, onward)   Start      Dose/Rate Route Frequency Ordered Stop   08/01/18 0200  cefTRIAXone (ROCEPHIN) 2 g in sodium chloride 0.9 % 100 mL IVPB     2 g 200 mL/hr over 30 Minutes Intravenous  Once 08/01/18 0158 08/01/18 0301        Assessment/Plan Symptomatic cholelithiasis - Patient with symptomatic cholelithiasis and abdominal pain uncontrolled in the ED. Will admit to med-surg and plan for laparoscopic cholecystectomy later today. She already received a dose of rocephin. Keep NPO.  ID - rocephin 10/5 x1 VTE - SCDs, lovenox FEN - IVF, NPO Foley - none  Wellington Hampshire, [facility] 08/01/2018, 7:44 AM Pager: [phone] Mon 7:00 am -11:30 AM Tues-Fri 7:00 am-4:30 pm Sat-Sun 7:00 am-11:30 am

## 2018-08-01 NOTE — Transfer of Care (Signed)
Immediate Anesthesia Transfer of Care Note  Patient: Glenda Merritt  Procedure(s) Performed: LAPAROSCOPIC CHOLECYSTECTOMY (N/A Abdomen)  Patient Location: PACU  Anesthesia Type:General  Level of Consciousness: awake and alert   Airway & Oxygen Therapy: Patient Spontanous Breathing and Patient connected to face mask oxygen  Post-op Assessment: Report given to RN and Post -op Vital signs reviewed and stable  Post vital signs: Reviewed and stable  Last Vitals:  Vitals Value Taken Time  BP 134/73 08/01/2018  3:15 PM  Temp    Pulse 90 08/01/2018  3:15 PM  Resp 23 08/01/2018  3:15 PM  SpO2 100 % 08/01/2018  3:15 PM  Vitals shown include unvalidated device data.  Last Pain:  Vitals:   08/01/18 0704  TempSrc: Oral  PainSc:          Complications: No apparent anesthesia complications

## 2018-08-01 NOTE — Op Note (Signed)
08/01/2018 2:49 PM  PATIENT: Glenda Merritt  27 y.o. female  Patient Care Team: Patient, No Pcp Per as PCP - General (General Practice)  PRE-OPERATIVE DIAGNOSIS: Acute cholecystitis  POST-OPERATIVE DIAGNOSIS: Same  PROCEDURE: Laparoscopic cholecystectomy  SURGEON: Stephanie Coup. Joss Friedel, MD  ASSISTANT: Scrub RN   ANESTHESIA: General endotracheal  EBL: No intake/output data recorded.  DRAINS: None  SPECIMEN: Gallbladder  COUNTS: Sponge, needle and instrument counts were reported correct x2 at the conclusion of the operation  DISPOSITION: PACU in satisfactory condition  COMPLICATIONS: None  FINDINGS: Tense intrahepatic gallbladder. Mildly inflamed. Critical view of safety obtained prior to division of any structures.  Small amount of bile spillage during the case related to small hole created in the gallbladder while taking it off the liver. No stones were spilled.  INDICATION: Ms. Augsburger is a very pleasant 27 year old female with no known past medical history who presented to the emergency room with progressive right upper quadrant abdominal pain.  She has had this intermittently for the last 4 months. Described as sharp/crampy - usually better in a few hours; this time it has persisted over the last 2 days. Associated with persistent nausea, no appetite. No emesis.  Pain has worsened with PO intake this time and in past.  On exam, she was afebrile with normal vital signs.  She did have tenderness in her right upper quadrant and a positive Murphy sign.  Her Emilea Goga blood cell count, LFTs, and lipase were normal.  She underwent sonogram in the ER which demonstrated a gallbladder with stones, normal wall thickness no pericholecystic fluid.  Positive sonographic Murphy sign.  Common bile duct measured 4 to 5 mm in size.  Given her persistent pain nausea, she was thought to likely have underlying cholecystitis.  Options were discussed before and she opted to pursue surgery.  Please refer to  H&P for details regarding this discussion.  DESCRIPTION:   The patient was identified & brought into the operating room. The patient was positioned supine on the OR table. SCDs were in place and active during the entire case. The patient underwent general endotracheal anesthesia. The abdomen was prepped and draped in the standard sterile fashion and antibiotics were administered. A surgical timeout was performed and confirmed our plan.   A supraumbilical incision was made. The umbilical stalk was grasped and retracted outwardly. The supraumbilical fascia was identified and incised. The peritoneal cavity was gently entered bluntly. A purse-string 0 Vicryl suture was placed. The Hasson cannula was inserted into the peritoneal cavity and insufflation with CO2 commenced to . A laparoscope was inserted into the peritoneal cavity and inspection confirmed no evidence of trocar site complications. The patient was then positioned in reverse Trendelenburg with the left side down. 3 additional 5mm trocars were placed along the right subcostal line - one 5mm port in mid subcostal region, another 5mm port in the right flank near the anterior axillary line, and a third 5mm port in the left subxiphoid region obliquely near the falciform ligament.  The liver and gallbladder were inspected.  The gallbladder was somewhat intrahepatic and there was also a small ectopic focus of what appeared to be liver parenchyma on the right ureter resection of bladder abdominal side of the gallbladder.  The gallbladder fundus was grasped and elevated cephalad. An additional grasper was then placed on the infundibulum of the gallbladder and the infundibulum was retracted laterally. Gentle blunt dissection was then employed with a Art gallery manager working down into [employer]. The peritoneum on both  sides of the gallbladder was opened with hook cautery. The cystic duct was identified, carefully circumferentially dissected. The  cystic artery was then carefully circumferentially dissected. The space between the cystic artery and hepatocystic plate was developed such that the liver could be seen through a window medial to the cystic artery. The triangle of Calot was cleared of all fibrofatty tissue. At this point, a critical view of safety was achieved and the only structures visualized was the skeletonized cystic duct laterally, the skeletonized cystic artery and the liver through the window medial to the artery.   The cystic duct and artery were clipped with 2 clips on the "stay" side and 1 clip on the specimen side. The cystic duct and artery were then divided. The gallbladder was then freed from its remaining attachments to the liver using electrocautery and placed into an endocatch bag.  During this portion of the dissection, there was a small amount of bile spillage a little made in the gallbladder during dissection A small amount of sludge that escaped was evacuated with suction irrigator. Hemostasis was achieved and then re-verified. The rest of the abdomen was inspected no injury nor bleeding elsewhere was identified. The RUQ was irrigated with normal saline. The clips and gallbladder fossa were reinspected and noted to be in place and hemostatic.  The gallbladder and endocatch bag was then removed from the umbilical port site and passed off as specimen. The RUQ ports were removed under direct visualization and noted to be hemostatic. The umbilical fascia was then closed using 0 Vicryl suture.  The fascia was palpated and noted to be completely closed.  The skin of all incision sites was approximated with 4-0 monocryl subcuticular suture and dermabond applied. The patient was then extubated and transferred to a stretcher for transport to PACU in satisfactory condition.

## 2018-08-02 LAB — CBC
HEMATOCRIT: 36.1 % (ref 36.0–46.0)
Hemoglobin: 11.3 g/dL — ABNORMAL LOW (ref 12.0–15.0)
MCH: 26.3 pg (ref 26.0–34.0)
MCHC: 31.3 g/dL (ref 30.0–36.0)
MCV: 84.1 fL (ref 80.0–100.0)
NRBC: 0 % (ref 0.0–0.2)
PLATELETS: 285 10*3/uL (ref 150–400)
RBC: 4.29 MIL/uL (ref 3.87–5.11)
RDW: 13.9 % (ref 11.5–15.5)
WBC: 12.4 10*3/uL — ABNORMAL HIGH (ref 4.0–10.5)

## 2018-08-02 NOTE — Progress Notes (Signed)
Patient discharged to home with family, discharge instructions reviewed with patient who verbalized understanding. No new RX. 

## 2018-08-02 NOTE — Discharge Summary (Signed)
Physician Discharge Summary [facility]  Patient ID: Glenda Merritt MRN: [id number] DOB/AGE: 29/05/1991 29 y.o.  Admit date: 07/31/2018 Discharge date: 08/02/2018  Admission Diagnoses:  Acute cholecystitis, abd pain  Discharge Diagnoses:  Active Problems:   S/P laparoscopic cholecystectomy   Symptomatic cholelithiasis   Discharged Condition: good  Hospital Course: Patient was admitted for observation following gallbladder surgery.  Post op course was uncomplicated.  Pain was well controlled.  Tolerated diet.  Patient was prepared for discharge home on POD#1.  Consults: None  Treatments: surgery: lap cholecystectomy  Discharge Exam: Blood pressure 137/89, pulse 97, temperature 98.6 F (37 C), temperature source Oral, resp. rate 16, height 5\' 2"  (1.575 m), weight 113.4 kg, last menstrual period 07/20/2018, SpO2 98 %. HEENT - clear Neck - soft Chest - clear bilaterally Cor - RRR Abd - soft, obese; wounds dry and intact with Dermabond in place; minimal tenderness  Disposition: Home  Discharge Instructions    Diet - low sodium heart healthy   Complete by:  As directed    Discharge instructions   Complete by:  As directed    [facility]  LAPAROSCOPIC SURGERY:  POST-OP INSTRUCTIONS  Always review your discharge instruction sheet given to you by the facility where your surgery was performed.  A prescription for pain medication may be given to you upon discharge.  Take your pain medication as prescribed.  If narcotic pain medicine is not needed, then you may take acetaminophen (Tylenol) or ibuprofen (Advil) as needed.  Take your usually prescribed medications unless otherwise directed.  If you need a refill on your pain medication, please contact your pharmacy.  They will contact our office to request authorization. Prescriptions will not be filled after 5 P.M. or on weekends.  You should follow a light diet the first few days  after arrival home, such as soup and crackers or toast.  Be sure to include plenty of fluids daily.  Most patients will experience some swelling and bruising in the area of the incisions.  Ice packs will help.  Swelling and bruising can take several days to resolve.   It is common to experience some constipation after surgery.  Increasing fluid intake and taking a stool softener (such as Colace) will usually help or prevent this problem from occurring.  A mild laxative (Milk of Magnesia or Miralax) should be taken according to package instructions if there has been no bowel movement after 48 hours.  You will have steri-strips and a gauze dressing over your incisions.  You may remove the gauze bandage on the second day after surgery, and you may shower at that time.  Leave your steri-strips (small skin tapes) in place directly over the incision.  These strips should remain on the skin for 5-7 days and then be removed.  You may get them wet in the shower and pat them dry.  Any sutures or staples will be removed at the office during your follow-up visit.  ACTIVITIES:  You may resume regular (light) daily activities beginning the next day - such as daily self-care, walking, climbing stairs - gradually increasing activities as tolerated.  You may have sexual intercourse when it is comfortable.  Refrain from any heavy lifting or straining until approved by your doctor.  You may drive when you are no longer taking prescription pain medication, you can comfortably wear a seatbelt, and you can safely maneuver your car and apply brakes.  You should see your doctor in  the office for a follow-up appointment approximately 2-3 weeks after your surgery.  Make sure that you call for this appointment within a day or two after you arrive home to insure a convenient appointment time.  WHEN TO CALL YOUR DOCTOR: Fever over 101.0 Inability to urinate Continued bleeding from incision Increased pain, redness, or  drainage from the incision Increasing abdominal pain  The clinic staff is available to answer your questions during regular business hours.  Please don't hesitate to call and ask to speak to one of the nurses for clinical concerns.  If you have a medical emergency, go to the nearest emergency room or call 911.  A surgeon from [facility] is always on call for the hospital.  Patient is restricted to 10 lbs lifting for 2 weeks.  Patient may shower.  Patient to be out of work for 2 weeks.  Paperwork for work should be brought to our office for completion.  Velora Heckler, MD, [facility] Office: [phone] Toll Free:  [phone] FAX [phone]  Website: [email or web address]   Increase activity slowly   Complete by:  As directed    No dressing needed   Complete by:  As directed      Allergies as of 08/02/2018      Reactions   Shellfish Allergy Anaphylaxis   Lactose Intolerance (gi) Other (See Comments)   GI upset   Latex Rash      Medication List    TAKE these medications   omeprazole 20 MG capsule Commonly known as:  PRILOSEC Take 1 capsule (20 mg total) by mouth daily.   ondansetron 4 MG disintegrating tablet Commonly known as:  ZOFRAN-ODT Take 1 tablet (4 mg total) by mouth every 8 (eight) hours as needed for nausea or vomiting.   sucralfate 1 GM/10ML suspension Commonly known as:  CARAFATE Take 10 mLs (1 g total) by mouth 4 (four) times daily -  with meals and at bedtime.      Follow-up Information    [facility] [place]. Go on 08/14/2018.   Specialty:  General Surgery Why:  Your appointment is 11/7 at 3 pm Please arrive 30 minutes prior to your appointment to check in and fill out paperwork. Bring photo ID and insurance information. Contact information: [address] [place] [address] [phone]          Velora Heckler, MD, [facility] Office: [phone]   Signed: Velora Heckler 08/02/2018, 8:05 AM

## 2018-08-04 ENCOUNTER — Encounter (HOSPITAL_COMMUNITY): Payer: Self-pay | Admitting: Surgery

## 2018-08-04 NOTE — Anesthesia Postprocedure Evaluation (Signed)
Anesthesia Post Note  Patient: Glenda Merritt  Procedure(s) Performed: LAPAROSCOPIC CHOLECYSTECTOMY (N/A Abdomen)     Patient location during evaluation: PACU Anesthesia Type: General Level of consciousness: awake and alert Pain management: pain level controlled Vital Signs Assessment: post-procedure vital signs reviewed and stable Respiratory status: spontaneous breathing, nonlabored ventilation, respiratory function stable and patient connected to nasal cannula oxygen Cardiovascular status: blood pressure returned to baseline and stable Postop Assessment: no apparent nausea or vomiting Anesthetic complications: no    Last Vitals:  Vitals:   08/01/18 1832 08/01/18 2151  BP: 132/83 137/89  Pulse: 89 97  Resp: 16 16  Temp: 36.5 C 37 C  SpO2: 97% 98%    Last Pain:  Vitals:   08/02/18 0750  TempSrc:   PainSc: 3                  Kennieth Rad

## 2018-10-02 ENCOUNTER — Encounter (HOSPITAL_COMMUNITY): Payer: Self-pay

## 2018-10-02 ENCOUNTER — Emergency Department (HOSPITAL_COMMUNITY)
Admission: EM | Admit: 2018-10-02 | Discharge: 2018-10-03 | Disposition: A | Discharge status: Home / Self Care | Payer: Self-pay | Attending: Emergency Medicine | Admitting: Emergency Medicine

## 2018-10-02 ENCOUNTER — Other Ambulatory Visit: Payer: Self-pay

## 2018-10-02 DIAGNOSIS — J9801 Acute bronchospasm: Secondary | ICD-10-CM | POA: Insufficient documentation

## 2018-10-02 DIAGNOSIS — R112 Nausea with vomiting, unspecified: Secondary | ICD-10-CM | POA: Insufficient documentation

## 2018-10-02 DIAGNOSIS — J101 Influenza due to other identified influenza virus with other respiratory manifestations: Secondary | ICD-10-CM | POA: Insufficient documentation

## 2018-10-02 DIAGNOSIS — R197 Diarrhea, unspecified: Secondary | ICD-10-CM | POA: Insufficient documentation

## 2018-10-02 LAB — INFLUENZA PANEL BY PCR (TYPE A & B)
INFLAPCR: NEGATIVE
Influenza B By PCR: POSITIVE — AB

## 2018-10-02 MED ORDER — ALBUTEROL SULFATE (2.5 MG/3ML) 0.083% IN NEBU
5.0000 mg | INHALATION_SOLUTION | Freq: Once | RESPIRATORY_TRACT | Status: AC
  Administered 2018-10-02: 5 mg via RESPIRATORY_TRACT
  Filled 2018-10-02: qty 6

## 2018-10-02 MED ORDER — ACETAMINOPHEN 325 MG PO TABS
650.0000 mg | ORAL_TABLET | Freq: Once | ORAL | Status: AC | PRN
  Administered 2018-10-02: 650 mg via ORAL
  Filled 2018-10-02: qty 2

## 2018-10-02 NOTE — ED Triage Notes (Signed)
Pt here with cough and fevers for the last 3 days.  A&Ox4, tachycardic in triage.

## 2018-10-02 NOTE — ED Notes (Signed)
Pt had wheezing in the lung bases, treated with albuterol tx in triage.

## 2018-10-03 MED ORDER — PREDNISONE 20 MG PO TABS
60.0000 mg | ORAL_TABLET | Freq: Once | ORAL | Status: AC
  Administered 2018-10-03: 60 mg via ORAL
  Filled 2018-10-03: qty 3

## 2018-10-03 MED ORDER — ONDANSETRON HCL 4 MG PO TABS: 4.0000 mg | ORAL_TABLET | Freq: Four times a day (QID) | ORAL | 0 refills | Status: DC | PRN

## 2018-10-03 MED ORDER — IPRATROPIUM-ALBUTEROL 0.5-2.5 (3) MG/3ML IN SOLN
3.0000 mL | Freq: Once | RESPIRATORY_TRACT | Status: AC
  Administered 2018-10-03: 3 mL via RESPIRATORY_TRACT
  Filled 2018-10-03: qty 3

## 2018-10-03 MED ORDER — ONDANSETRON 4 MG PO TBDP
8.0000 mg | ORAL_TABLET | Freq: Once | ORAL | Status: AC
  Administered 2018-10-03: 8 mg via ORAL
  Filled 2018-10-03: qty 2

## 2018-10-03 MED ORDER — PREDNISONE 50 MG PO TABS: 50.0000 mg | ORAL_TABLET | Freq: Every day | ORAL | 0 refills | Status: DC

## 2018-10-03 NOTE — ED Provider Notes (Signed)
[facility]   CSN: [id number] Arrival date & time: 10/02/18  2019     History   Chief Complaint Chief Complaint  Patient presents with  . Cough  . Influenza    HPI Glenda Merritt is a 27 y.o. female.  The history is provided by the patient.  Cough   Influenza  Presenting symptoms: cough   She has a history of asthma and eczema, and comes in with 3-day history of subjective fever, chills, sweats.  There has been nasal congestion with clear rhinorrhea and cough productive of clear sputum.  There has been associated nausea, vomiting, diarrhea as well as generalized arthralgias and myalgias.  She denies any sick contacts.  She did not receive the influenza immunization this season.  She has been using her home nebulizer without any benefit.  She has tried taking Robitussin without benefit.  She is a non-smoker.  Past Medical History:  Diagnosis Date  . Asthma   . Eczema     Patient Active Problem List   Diagnosis Date Noted  . S/P laparoscopic cholecystectomy 08/01/2018  . Symptomatic cholelithiasis 08/01/2018    Past Surgical History:  Procedure Laterality Date  . CHOLECYSTECTOMY N/A 08/01/2018   Procedure: LAPAROSCOPIC CHOLECYSTECTOMY;  Surgeon: Andria MeuseWhite, Christopher M, MD;  Location: [facility];  Service: General;  Laterality: N/A;  . TUBAL LIGATION       OB History   No obstetric history on file.      Home Medications    Prior to Admission medications   Medication Sig Start Date End Date Taking? Authorizing Provider  omeprazole (PRILOSEC) 20 MG capsule Take 1 capsule (20 mg total) by mouth daily. 07/31/18   Georgetta HaberBurky, Natalie B, NP  ondansetron (ZOFRAN ODT) 4 MG disintegrating tablet Take 1 tablet (4 mg total) by mouth every 8 (eight) hours as needed for nausea or vomiting. 07/31/18   Georgetta HaberBurky, Natalie B, NP  sucralfate (CARAFATE) 1 GM/10ML suspension Take 10 mLs (1 g total) by mouth 4 (four) times daily -  with meals  and at bedtime. 07/31/18   Georgetta HaberBurky, Natalie B, NP    Family History History reviewed. No pertinent family history.  Social History Social History   Tobacco Use  . Smoking status: Never Smoker  . Smokeless tobacco: Never Used  Substance Use Topics  . Alcohol use: Yes  . Drug use: Yes    Types: Marijuana     Allergies   Shellfish allergy; Lactose intolerance (gi); and Latex   Review of Systems Review of Systems  Respiratory: Positive for cough.   All other systems reviewed and are negative.    Physical Exam Updated Vital Signs BP 121/84 (BP Location: Left Arm)   Pulse (!) 108   Temp (!) 100.7 F (38.2 C) (Oral)   Resp 20   SpO2 96%   Physical Exam Vitals signs and nursing note reviewed.    27 year old female, resting comfortably and in no acute distress. Vital signs are significant for fever and elevated heart rate. Oxygen saturation is 96%, which is normal. Head is normocephalic and atraumatic. PERRLA, EOMI. Oropharynx is clear. Neck is nontender and supple without adenopathy or JVD. Back is nontender and there is no CVA tenderness. Lungs have coarse expiratory wheezes without rales or rhonchi. Chest is nontender. Heart has regular rate and rhythm without murmur. Abdomen is soft, flat, nontender without masses or hepatosplenomegaly and peristalsis is normoactive. Extremities have no cyanosis or edema, full range of motion  is present. Skin is warm and dry without rash. Neurologic: Mental status is normal, cranial nerves are intact, there are no motor or sensory deficits.  ED Treatments / Results  Labs (all labs ordered are listed, but only abnormal results are displayed) Labs Reviewed  INFLUENZA PANEL BY PCR (TYPE A & B) - Abnormal; Notable for the following components:      Result Value   Influenza B By PCR POSITIVE (*)    All other components within normal limits   Procedures Procedures   Medications Ordered in ED Medications  ipratropium-albuterol  (DUONEB) 0.5-2.5 (3) MG/3ML nebulizer solution 3 mL (has no administration in time range)  predniSONE (DELTASONE) tablet 60 mg (has no administration in time range)  ondansetron (ZOFRAN-ODT) disintegrating tablet 8 mg (has no administration in time range)  acetaminophen (TYLENOL) tablet 650 mg (650 mg Oral Given 10/02/18 2112)  albuterol (PROVENTIL) (2.5 MG/3ML) 0.083% nebulizer solution 5 mg (5 mg Nebulization Given 10/02/18 2112)     Initial Impression / Assessment and Plan / ED Course  I have reviewed the triage vital signs and the nursing notes.  Pertinent lab results that were available during my care of the patient were reviewed by me and considered in my medical decision making (see chart for details).  Influenza-like illness.  Influenza screen is positive for influenza B.  She is outside of the timeframe where antiviral medication can be helpful.  She will need symptomatic treatment.  She shows evidence of bronchospasm and will be given nebulizer treatment with albuterol and ipratropium.  She will be started on prednisone.  She is given ondansetron for nausea.  Following nebulizer treatment, lungs are clear and she is feeling considerably better.  She is discharged with prescriptions for prednisone and ondansetron, told to use loperamide as needed for diarrhea.  Return precautions discussed.  Final Clinical Impressions(s) / ED Diagnoses   Final diagnoses:  Influenza B  Non-intractable vomiting with nausea, unspecified vomiting type  Diarrhea, unspecified type  Bronchospasm    ED Discharge Orders         Ordered    ondansetron (ZOFRAN) 4 MG tablet  Every 6 hours PRN     10/03/18 0240    predniSONE (DELTASONE) 50 MG tablet  Daily     10/03/18 0240           Dione BoozeGlick, Brilyn Tuller, MD 10/03/18 [phone]

## 2018-10-03 NOTE — Discharge Instructions (Signed)
Rest.  Drink plenty of fluids.  Take acetaminophen and/or ibuprofen for fever or aching.  Continue to use your inhaler and nebulizer as needed for coughing or wheezing.  Take loperamide (Imodium  AD) as needed for diarrhea.  Return if symptoms seem to be getting worse.  Even though you have the flu, you should get the influenza immunization once you have recovered from this episode.  In the future, he should get the influenza immunization every year.

## 2018-10-03 NOTE — ED Notes (Signed)
Patient verbalizes understanding of discharge instructions. Opportunity for questioning and answers were provided. Armband removed by staff, pt discharged from ED home via POV with family. 

## 2018-10-03 NOTE — ED Notes (Signed)
Pt reports flu-like symptoms for the past 3 days. Pt reports feeling hot and then cold but never actually took a numeric temperature. Pt denies being around anyone with a fever.

## 2019-04-16 ENCOUNTER — Telehealth: Payer: Self-pay | Admitting: *Deleted

## 2019-04-16 DIAGNOSIS — Z20822 Contact with and (suspected) exposure to covid-19: Secondary | ICD-10-CM

## 2019-04-16 NOTE — Telephone Encounter (Signed)
Pt referred for COVID-19 testing by [name].   Pt called and scheduled for testing on 04/16/09 at [facility] site. Pt advised to wear a mask and to remain in the car at appt time. Understanding verbalized.

## 2019-04-17 ENCOUNTER — Other Ambulatory Visit: Payer: Self-pay

## 2019-04-17 DIAGNOSIS — Z20822 Contact with and (suspected) exposure to covid-19: Secondary | ICD-10-CM

## 2019-04-23 LAB — NOVEL CORONAVIRUS, NAA: SARS-CoV-2, NAA: NOT DETECTED

## 2019-09-14 ENCOUNTER — Encounter (HOSPITAL_COMMUNITY): Payer: Self-pay | Admitting: Emergency Medicine

## 2019-09-14 ENCOUNTER — Emergency Department (HOSPITAL_COMMUNITY)
Admission: EM | Admit: 2019-09-14 | Discharge: 2019-09-15 | Disposition: A | Discharge status: Home / Self Care | Payer: Commercial Managed Care - PPO | Attending: Emergency Medicine | Admitting: Emergency Medicine

## 2019-09-14 ENCOUNTER — Other Ambulatory Visit: Payer: Self-pay

## 2019-09-14 DIAGNOSIS — A549 Gonococcal infection, unspecified: Secondary | ICD-10-CM | POA: Diagnosis not present

## 2019-09-14 DIAGNOSIS — J45909 Unspecified asthma, uncomplicated: Secondary | ICD-10-CM | POA: Insufficient documentation

## 2019-09-14 DIAGNOSIS — N898 Other specified noninflammatory disorders of vagina: Secondary | ICD-10-CM | POA: Diagnosis present

## 2019-09-14 DIAGNOSIS — Z202 Contact with and (suspected) exposure to infections with a predominantly sexual mode of transmission: Secondary | ICD-10-CM | POA: Diagnosis not present

## 2019-09-14 DIAGNOSIS — Z79899 Other long term (current) drug therapy: Secondary | ICD-10-CM | POA: Insufficient documentation

## 2019-09-14 DIAGNOSIS — R809 Proteinuria, unspecified: Secondary | ICD-10-CM | POA: Diagnosis not present

## 2019-09-14 DIAGNOSIS — Z91040 Latex allergy status: Secondary | ICD-10-CM | POA: Insufficient documentation

## 2019-09-14 LAB — URINALYSIS, ROUTINE W REFLEX MICROSCOPIC
Bacteria, UA: NONE SEEN
Bilirubin Urine: NEGATIVE
Glucose, UA: NEGATIVE mg/dL
Hgb urine dipstick: NEGATIVE
Ketones, ur: NEGATIVE mg/dL
Leukocytes,Ua: NEGATIVE
Nitrite: NEGATIVE
Protein, ur: 100 mg/dL — AB
Specific Gravity, Urine: 1.029 (ref 1.005–1.030)
pH: 6 (ref 5.0–8.0)

## 2019-09-14 NOTE — ED Triage Notes (Signed)
Pt believes she has been exposed to STD, reports "it burns when I pee,"X4 days.

## 2019-09-15 LAB — HIV ANTIBODY (ROUTINE TESTING W REFLEX): HIV Screen 4th Generation wRfx: NONREACTIVE

## 2019-09-15 LAB — RPR: RPR Ser Ql: NONREACTIVE

## 2019-09-15 LAB — PREGNANCY, URINE: Preg Test, Ur: NEGATIVE

## 2019-09-15 MED ORDER — METRONIDAZOLE 500 MG PO TABS
2000.0000 mg | ORAL_TABLET | Freq: Once | ORAL | Status: AC
  Administered 2019-09-15: 2000 mg via ORAL
  Filled 2019-09-15: qty 4

## 2019-09-15 MED ORDER — AZITHROMYCIN 250 MG PO TABS
1000.0000 mg | ORAL_TABLET | Freq: Once | ORAL | Status: AC
  Administered 2019-09-15: 1000 mg via ORAL
  Filled 2019-09-15: qty 4

## 2019-09-15 MED ORDER — CEFTRIAXONE SODIUM 250 MG IJ SOLR
250.0000 mg | Freq: Once | INTRAMUSCULAR | Status: AC
  Administered 2019-09-15: 250 mg via INTRAMUSCULAR
  Filled 2019-09-15: qty 250

## 2019-09-15 MED ORDER — LIDOCAINE HCL (PF) 1 % IJ SOLN
INTRAMUSCULAR | Status: AC
  Administered 2019-09-15: 1 mL
  Filled 2019-09-15: qty 5

## 2019-09-15 NOTE — Discharge Instructions (Addendum)
Thank you for allowing me to care for you today in the Emergency Department.   Your urine had protein in it.  Please call the number on your discharge paperwork to get established with a primary care provider to have a repeat urine completed to make sure that the protein in your urine resolves.  You have been treated in the emergency department tonight for gonorrhea, trichomonas, and chlamydia.  You were also tested for syphilis and HIV.  These tests are still pending in the lab, and if they're positive, someone from the hospital will call you at the number that you provided registration today.  Avoid alcohol for the next few days because the antibiotic metronidazole can cause severe vomiting when you drink alcohol after taking this medication.  The CDC recommends that you abstain from all sexual activities for the next 7 days after being treated for a sexually transmitted infection. You should also make sure that any and all sexual assistance devices or toys have been sanitized.  You can go to the [facility] for regular sexually transmitted infection screenings.  If you develop any new or worsening symptoms including fever, chills, worsening abdominal or pelvic pain, please return to the emergency department for re-evaluation.

## 2019-09-15 NOTE — ED Provider Notes (Signed)
[place] EMERGENCY DEPARTMENT Provider Note   CSN: [id number] Arrival date & time: 09/14/19  2151     History   Chief Complaint Chief Complaint  Patient presents with  . Exposure to STD    HPI Glenda Merritt is a 28 y.o. female with a history of asthma who presents to the emergency department with a chief complaint of vaginal discharge.  Discharge does not have an odor.  The patient reports that she has been having thick white vaginal discharge for the last week accompanied by dysuria for the last 4 days.  She denies fever, chills, abdominal or pelvic pain, rectal pain, rash, or vaginal bleeding.  No treatment prior to arrival.  She reports that for the last month that she has been sexually active with 1 female partner.  They do not use condoms.  Previously, the patient did have multiple partners, but reports that she was adamant about using a condom with all partners except for her current significant other.  She does have a latex allergy and uses lamb skin condom.     The history is provided by the patient. No language interpreter was used.    Past Medical History:  Diagnosis Date  . Asthma   . Eczema     Patient Active Problem List   Diagnosis Date Noted  . S/P laparoscopic cholecystectomy 08/01/2018  . Symptomatic cholelithiasis 08/01/2018    Past Surgical History:  Procedure Laterality Date  . CHOLECYSTECTOMY N/A 08/01/2018   Procedure: LAPAROSCOPIC CHOLECYSTECTOMY;  Surgeon: Ileana Roup, MD;  Location: [facility];  Service: General;  Laterality: N/A;  . TUBAL LIGATION       OB History   No obstetric history on file.      Home Medications    Prior to Admission medications   Medication Sig Start Date End Date Taking? Authorizing Provider  omeprazole (PRILOSEC) 20 MG capsule Take 1 capsule (20 mg total) by mouth daily. 07/31/18   Zigmund Gottron, NP  ondansetron (ZOFRAN) 4 MG tablet Take 1 tablet (4 mg total) by mouth every 6 (six)  hours as needed for nausea or vomiting. 03/50/09   Delora Fuel, MD  predniSONE (DELTASONE) 50 MG tablet Take 1 tablet (50 mg total) by mouth daily. 38/18/29   Delora Fuel, MD  sucralfate (CARAFATE) 1 GM/10ML suspension Take 10 mLs (1 g total) by mouth 4 (four) times daily -  with meals and at bedtime. 07/31/18   Zigmund Gottron, NP    Family History No family history on file.  Social History Social History   Tobacco Use  . Smoking status: Never Smoker  . Smokeless tobacco: Never Used  Substance Use Topics  . Alcohol use: Yes  . Drug use: Yes    Types: Marijuana     Allergies   Shellfish allergy, Lactose intolerance (gi), and Latex   Review of Systems Review of Systems  Constitutional: Negative for activity change.  Respiratory: Negative for shortness of breath.   Cardiovascular: Negative for chest pain.  Gastrointestinal: Negative for abdominal pain, diarrhea, nausea and vomiting.  Genitourinary: Positive for dysuria and vaginal discharge. Negative for frequency, hematuria, urgency, vaginal bleeding and vaginal pain.  Musculoskeletal: Negative for arthralgias, back pain, myalgias, neck pain and neck stiffness.  Skin: Negative for rash.  Allergic/Immunologic: Negative for immunocompromised state.  Neurological: Negative for syncope, weakness, numbness and headaches.  Psychiatric/Behavioral: Negative for confusion.   Physical Exam Updated Vital Signs BP 123/89 (BP Location: Right Arm)   Pulse  99   Temp 98.7 F (37.1 C) (Oral)   Resp 16   Ht 5\' 2"  (1.575 m)   Wt 127 kg   SpO2 99%   BMI 51.21 kg/m   Physical Exam Vitals signs and nursing note reviewed.  Constitutional:      General: She is not in acute distress.    Appearance: She is not ill-appearing, toxic-appearing or diaphoretic.  HENT:     Head: Normocephalic.  Eyes:     Conjunctiva/sclera: Conjunctivae normal.  Neck:     Musculoskeletal: Neck supple.  Cardiovascular:     Rate and Rhythm: Normal rate  and regular rhythm.     Heart sounds: No murmur. No friction rub. No gallop.   Pulmonary:     Effort: Pulmonary effort is normal. No respiratory distress.     Breath sounds: No stridor. No wheezing, rhonchi or rales.  Chest:     Chest wall: No tenderness.  Abdominal:     General: There is no distension.     Palpations: Abdomen is soft. There is no mass.     Tenderness: There is no abdominal tenderness. There is no right CVA tenderness, left CVA tenderness, guarding or rebound.     Hernia: No hernia is present.     Comments: Abdomen is soft and nontender.  Skin:    General: Skin is warm.     Findings: No rash.  Neurological:     Mental Status: She is alert.  Psychiatric:        Behavior: Behavior normal.     ED Treatments / Results  Labs (all labs ordered are listed, but only abnormal results are displayed) Labs Reviewed  URINALYSIS, ROUTINE W REFLEX MICROSCOPIC - Abnormal; Notable for the following components:      Result Value   Protein, ur 100 (*)    All other components within normal limits  PREGNANCY, URINE  RPR  HIV ANTIBODY (ROUTINE TESTING W REFLEX)  GC/CHLAMYDIA PROBE AMP ([place]) NOT AT [facility]    EKG None  Radiology No results found.  Procedures Procedures (including critical care time)  Medications Ordered in ED Medications  cefTRIAXone (ROCEPHIN) injection 250 mg (250 mg Intramuscular Given 09/15/19 0342)  metroNIDAZOLE (FLAGYL) tablet 2,000 mg (2,000 mg Oral Given 09/15/19 0346)  azithromycin (ZITHROMAX) tablet 1,000 mg (1,000 mg Oral Given 09/15/19 0344)  lidocaine (PF) (XYLOCAINE) 1 % injection (1 mL  Given 09/15/19 0341)     Initial Impression / Assessment and Plan / ED Course  I have reviewed the triage vital signs and the nursing notes.  Pertinent labs & imaging results that were available during my care of the patient were reviewed by me and considered in my medical decision making (see chart for details).        Patient to be  discharged with instructions to follow up with OBGYN. Discussed importance of using protection when sexually active.  Shared decision-making conversation.  The patient declined pelvic exam, but was agreeable to self swab in the ER.  Discussed unremarkable urinalysis results for infection and negative pregnancy test.  Pt understands that they have GC/Chlamydia cultures pending and that they will need to inform all sexual partners if results return positive.  They are also aware that HIV and syphilis testing are pending.  Pt has been treated prophylacticly with azithromycin, Flagyl, and rocephin due to pts history, pelvic exam, and wet prep with increased WBCs. Pt not concerning for PID because hemodynamically stable and no cervical motion tenderness on pelvic  exam.  Pt has been advised to not drink alcohol after taking Flagyl.   Final Clinical Impressions(s) / ED Diagnoses   Final diagnoses:  Possible exposure to STD  Proteinuria, unspecified type    ED Discharge Orders    None       Barkley Boards, PA-C 09/15/19 0534    Shon Baton, MD 09/16/19 [phone]

## 2019-09-16 LAB — GC/CHLAMYDIA PROBE AMP (~~LOC~~) NOT AT ARMC
Chlamydia: NEGATIVE
Neisseria Gonorrhea: POSITIVE — AB

## 2020-09-04 ENCOUNTER — Emergency Department (HOSPITAL_COMMUNITY)
Admission: EM | Admit: 2020-09-04 | Discharge: 2020-09-05 | Disposition: A | Discharge status: Home / Self Care | Payer: Commercial Managed Care - PPO | Attending: Emergency Medicine | Admitting: Emergency Medicine

## 2020-09-04 ENCOUNTER — Encounter (HOSPITAL_COMMUNITY): Payer: Self-pay

## 2020-09-04 ENCOUNTER — Ambulatory Visit (HOSPITAL_COMMUNITY)
Admission: EM | Admit: 2020-09-04 | Discharge: 2020-09-04 | Disposition: A | Discharge status: Home / Self Care | Payer: Commercial Managed Care - PPO

## 2020-09-04 ENCOUNTER — Other Ambulatory Visit: Payer: Self-pay

## 2020-09-04 DIAGNOSIS — R1033 Periumbilical pain: Secondary | ICD-10-CM

## 2020-09-04 DIAGNOSIS — K429 Umbilical hernia without obstruction or gangrene: Secondary | ICD-10-CM | POA: Diagnosis not present

## 2020-09-04 DIAGNOSIS — R109 Unspecified abdominal pain: Secondary | ICD-10-CM | POA: Diagnosis present

## 2020-09-04 DIAGNOSIS — K439 Ventral hernia without obstruction or gangrene: Secondary | ICD-10-CM

## 2020-09-04 DIAGNOSIS — J45909 Unspecified asthma, uncomplicated: Secondary | ICD-10-CM | POA: Diagnosis not present

## 2020-09-04 DIAGNOSIS — Z91040 Latex allergy status: Secondary | ICD-10-CM | POA: Diagnosis not present

## 2020-09-04 LAB — CBC
HCT: 39.1 % (ref 36.0–46.0)
Hemoglobin: 12.2 g/dL (ref 12.0–15.0)
MCH: 26.2 pg (ref 26.0–34.0)
MCHC: 31.2 g/dL (ref 30.0–36.0)
MCV: 84.1 fL (ref 80.0–100.0)
Platelets: 330 10*3/uL (ref 150–400)
RBC: 4.65 MIL/uL (ref 3.87–5.11)
RDW: 14.3 % (ref 11.5–15.5)
WBC: 7 10*3/uL (ref 4.0–10.5)
nRBC: 0 % (ref 0.0–0.2)

## 2020-09-04 LAB — COMPREHENSIVE METABOLIC PANEL
ALT: 22 U/L (ref 0–44)
AST: 19 U/L (ref 15–41)
Albumin: 4 g/dL (ref 3.5–5.0)
Alkaline Phosphatase: 58 U/L (ref 38–126)
Anion gap: 9 (ref 5–15)
BUN: 10 mg/dL (ref 6–20)
CO2: 26 mmol/L (ref 22–32)
Calcium: 9.2 mg/dL (ref 8.9–10.3)
Chloride: 102 mmol/L (ref 98–111)
Creatinine, Ser: 0.78 mg/dL (ref 0.44–1.00)
GFR, Estimated: >60 mL/min (ref >60)
Glucose, Bld: 100 mg/dL — ABNORMAL HIGH (ref 70–99)
Potassium: 3.8 mmol/L (ref 3.5–5.1)
Sodium: 137 mmol/L (ref 135–145)
Total Bilirubin: 0.3 mg/dL (ref 0.3–1.2)
Total Protein: 7.8 g/dL (ref 6.5–8.1)

## 2020-09-04 LAB — URINALYSIS, ROUTINE W REFLEX MICROSCOPIC
Bacteria, UA: NONE SEEN
Bilirubin Urine: NEGATIVE
Glucose, UA: NEGATIVE mg/dL
Ketones, ur: NEGATIVE mg/dL
Leukocytes,Ua: NEGATIVE
Nitrite: NEGATIVE
Protein, ur: 100 mg/dL — AB
Specific Gravity, Urine: 1.017 (ref 1.005–1.030)
pH: 5 (ref 5.0–8.0)

## 2020-09-04 LAB — LIPASE, BLOOD: Lipase: 23 U/L (ref 11–51)

## 2020-09-04 LAB — I-STAT BETA HCG BLOOD, ED (MC, WL, AP ONLY): I-stat hCG, quantitative: <5 m[IU]/mL (ref <5)

## 2020-09-04 MED ORDER — HYDROMORPHONE HCL 1 MG/ML IJ SOLN
0.5000 mg | Freq: Once | INTRAMUSCULAR | Status: AC
  Administered 2020-09-05: 0.5 mg via INTRAVENOUS
  Filled 2020-09-04: qty 1

## 2020-09-04 NOTE — ED Provider Notes (Signed)
[facility] Provider Note   CSN: [id number] Arrival date & time: 09/04/20  1815     History Chief Complaint  Patient presents with  . Abdominal Pain    Glenda Merritt is a 29 y.o. female with a history of asthma, eczema, and prior tubal ligation and cholecystectomy who presents to the emergency department from urgent care with complaints of abdominal pain for the past 4 days.  Patient states that she is having pain to the periumbilical region that is constant, mildly alleviated when laying on her side, worse with activity.  No other alleviating or aggravating factors.  Tried ibuprofen without much relief.  She has had similar pain intermittently since her cholecystectomy in 2019, but has never been constant or this uncomfortable.  She denies fever, chills, nausea, vomiting, diarrhea, constipation, melena, hematochezia, dysuria, or vaginal discharge.  She is on the last day of her menstrual cycle.  Her last bowel movement was shortly prior to arrival and was normal. HPI     Past Medical History:  Diagnosis Date  . Asthma   . Eczema     Patient Active Problem List   Diagnosis Date Noted  . S/P laparoscopic cholecystectomy 08/01/2018  . Symptomatic cholelithiasis 08/01/2018    Past Surgical History:  Procedure Laterality Date  . CHOLECYSTECTOMY N/A 08/01/2018   Procedure: LAPAROSCOPIC CHOLECYSTECTOMY;  Surgeon: Andria Meuse, MD;  Location: [facility];  Service: General;  Laterality: N/A;  . TUBAL LIGATION       OB History   No obstetric history on file.     History reviewed. No pertinent family history.  Social History   Tobacco Use  . Smoking status: Never Smoker  . Smokeless tobacco: Never Used  Substance Use Topics  . Alcohol use: Yes  . Drug use: Yes    Types: Marijuana    Home Medications Prior to Admission medications   Medication Sig Start Date End Date Taking? Authorizing Provider  omeprazole (PRILOSEC) 20 MG  capsule Take 1 capsule (20 mg total) by mouth daily. 07/31/18   Georgetta Haber, NP  ondansetron (ZOFRAN) 4 MG tablet Take 1 tablet (4 mg total) by mouth every 6 (six) hours as needed for nausea or vomiting. 10/03/18   Dione Booze, MD  predniSONE (DELTASONE) 50 MG tablet Take 1 tablet (50 mg total) by mouth daily. 10/03/18   Dione Booze, MD  sucralfate (CARAFATE) 1 GM/10ML suspension Take 10 mLs (1 g total) by mouth 4 (four) times daily -  with meals and at bedtime. 07/31/18   Georgetta Haber, NP    Allergies    Shellfish allergy, Lactose intolerance (gi), and Latex  Review of Systems   Review of Systems  Constitutional: Negative for chills and fever.  Respiratory: Negative for shortness of breath.   Cardiovascular: Negative for chest pain.  Gastrointestinal: Positive for abdominal pain. Negative for blood in stool, constipation, diarrhea, nausea and vomiting.  Genitourinary: Positive for vaginal bleeding (Menstruation.). Negative for dysuria and vaginal discharge.  Neurological: Negative for syncope.  All other systems reviewed and are negative.   Physical Exam Updated Vital Signs BP (!) 142/117 (BP Location: Left Arm)   Pulse 95   Temp 98.7 F (37.1 C) (Oral)   Resp 20   LMP 09/01/2020   SpO2 97%   Physical Exam Vitals and nursing note reviewed.  Constitutional:      General: She is not in acute distress.    Appearance: She is well-developed. She is not toxic-appearing.  HENT:     Head: Normocephalic and atraumatic.  Eyes:     General:        Right eye: No discharge.        Left eye: No discharge.     Conjunctiva/sclera: Conjunctivae normal.  Cardiovascular:     Rate and Rhythm: Normal rate and regular rhythm.  Pulmonary:     Effort: Pulmonary effort is normal. No respiratory distress.     Breath sounds: Normal breath sounds. No wheezing, rhonchi or rales.  Abdominal:     General: There is no distension.     Palpations: Abdomen is soft.     Tenderness: There is  abdominal tenderness in the periumbilical area. There is no guarding or rebound.  Musculoskeletal:     Cervical back: Neck supple.  Skin:    General: Skin is warm and dry.     Findings: No rash.  Neurological:     Mental Status: She is alert.     Comments: Clear speech.   Psychiatric:        Behavior: Behavior normal.    ED Results / Procedures / Treatments   Labs (all labs ordered are listed, but only abnormal results are displayed) Labs Reviewed  COMPREHENSIVE METABOLIC PANEL - Abnormal; Notable for the following components:      Result Value   Glucose, Bld 100 (*)    All other components within normal limits  URINALYSIS, ROUTINE W REFLEX MICROSCOPIC - Abnormal; Notable for the following components:   Hgb urine dipstick SMALL (*)    Protein, ur 100 (*)    All other components within normal limits  LIPASE, BLOOD  CBC  I-STAT BETA HCG BLOOD, ED ([facility], [facility], [facility] ONLY)    EKG None  Radiology CT Abdomen Pelvis W Contrast  Result Date: 09/05/2020 CLINICAL DATA:  Acute abdominal pain for several days EXAM: CT ABDOMEN AND PELVIS WITH CONTRAST TECHNIQUE: Multidetector CT imaging of the abdomen and pelvis was performed using the standard protocol following bolus administration of intravenous contrast. CONTRAST:  OMNIPAQUE IOHEXOL 300 MG/ML  SOLN COMPARISON:  None. FINDINGS: Lower chest: No acute abnormality. Hepatobiliary: No focal liver abnormality is seen. Status post cholecystectomy. No biliary dilatation. Pancreas: Unremarkable. No pancreatic ductal dilatation or surrounding inflammatory changes. Spleen: Normal in size without focal abnormality. Adrenals/Urinary Tract: Adrenal glands are within normal limits. Kidneys demonstrate a normal enhancement pattern bilaterally. No renal calculi or obstructive changes are seen. The bladder is decompressed. Stomach/Bowel: The appendix is within normal limits. No obstructive inflammatory changes of the colon are seen. Small bowel and stomach  are within normal limits. Vascular/Lymphatic: No significant vascular findings are present. No enlarged abdominal or pelvic lymph nodes. Scattered inguinal nodes are noted likely reactive in nature. Reproductive: Uterus and bilateral adnexa are unremarkable. Other: Fat containing supraumbilical hernia is noted. No bowel is noted within. The mouth of the hernia measures approximately 3.7 x 3.5 cm in greatest transverse and craniocaudad projections. Musculoskeletal: No acute or significant osseous findings. IMPRESSION: Large fat containing supraumbilical hernia as described. No other focal abnormality is noted. Electronically Signed   By: Alcide Clever M.D.   On: 09/05/2020 01:12    Procedures Procedures (including critical care time)  Medications Ordered in ED Medications  sodium chloride (PF) 0.9 % injection (has no administration in time range)  HYDROmorphone (DILAUDID) injection 0.5 mg (0.5 mg Intravenous Given 09/05/20 0003)  HYDROmorphone (DILAUDID) injection 1 mg (1 mg Intravenous Given 09/05/20 0025)  iohexol (OMNIPAQUE) 300 MG/ML solution 100  mL (100 mLs Intravenous Contrast Given 09/05/20 0055)  ketorolac (TORADOL) 15 MG/ML injection 15 mg (15 mg Intravenous Given 09/05/20 0127)    ED Course  I have reviewed the triage vital signs and the nursing notes.  Pertinent labs & imaging results that were available during my care of the patient were reviewed by me and considered in my medical decision making (see chart for details).  Clinical Course as of Sep 06 219  Mon Sep 05, 2020  0215 CT Abdomen Pelvis W Contrast [SP]    Clinical Course User Index [SP] Ahnesti Townsend, Pleas Koch, PA-C   MDM Rules/Calculators/A&P                          Patient presents to the ED with complaints of abdominal pain.  She is nontoxic, vitals without significant abnormality, BP is elevated, doubt hypertensive emergency.  On exam she has periumbilical tenderness to palpation.  Additional history obtained:    Additional history obtained from chart review and nursing note reviewed  Lab Tests:  I reviewed and interpreted labs, which included:  CBC, CMP, lipase, pregnancy test: Fairly unremarkable.  I have ordered Dilaudid for pain control.  We will further assess with CT abdomen/pelvis with contrast.  Patient with continued pain per RN- additional dose of dilaudid ordered.   Imaging Studies ordered:  I ordered imaging studies which included CT A/P, I independently visualized and interpreted imaging which showed Large fat containing supraumbilical hernia as described. No other focal abnormality is noted.  Fat containing supraumbilical hernia. No bowel contained. No obstruction/perf.   Patient with continued pain- toradol ordered, ice pack applied.   02:15: RE-EVAL: Patient feeling improved. Will discharge home with naproxen & robaxin with general surgery follow up. I discussed results, treatment plan, need for follow-up, and return precautions with the patient & her significant other at bedside. Provided opportunity for questions, patient & her significant other confirmed understanding and are in agreement with plan.   This is a shared visit with supervising physician Dr. Read Drivers who has independently evaluated patient & provided guidance in evaluation/management/disposition, in agreement with care   Portions of this note were generated with Dragon dictation software. Dictation errors may occur despite best attempts at proofreading.  Final Clinical Impression(s) / ED Diagnoses Final diagnoses:  Supraumbilical hernia    Rx / DC Orders ED Discharge Orders         Ordered    naproxen (NAPROSYN) 500 MG tablet  2 times daily PRN        09/05/20 0216    methocarbamol (ROBAXIN) 500 MG tablet  Every 8 hours PRN        09/05/20 0216           Cherly Anderson, PA-C 09/05/20 0222    Molpus, John, MD 09/05/20 0225

## 2020-09-04 NOTE — ED Notes (Signed)
Patient has pain around navel on Thursday.  Patient has had this pain since having gallbladder surgery (2019).  This pain usually comes and goes, normally lasts a few hours at a time.  Pain this time has lasted 3-4 days.patient has a visible enlarged area above navel.

## 2020-09-04 NOTE — ED Triage Notes (Signed)
Pt present a hernia, symptoms started 3 days ago. Pt states that she cannot stand or sit down. Pt states the pain normal comes and goes when she take an otc medication.

## 2020-09-04 NOTE — ED Triage Notes (Signed)
Pt presents with c/o abdominal pain at one spot in the middle of her abdomen. Pt was seen at [facility] today and they believed it to be a hernia and sent her to the ER. Pt reports pain has been present since Wednesday.

## 2020-09-04 NOTE — ED Notes (Signed)
Patient is being discharged from the Urgent Care and sent to the Emergency Department via private vehicle . Per stephanie np, patient is in need of higher level of care due to potential abdominal emergency. Patient is aware and verbalizes understanding of plan of care.  Vitals:   09/04/20 1742  BP: 131/88  Pulse: 85  Resp: 16  Temp: 98 F (36.7 C)  SpO2: 100%

## 2020-09-05 ENCOUNTER — Emergency Department (HOSPITAL_COMMUNITY): Payer: Commercial Managed Care - PPO

## 2020-09-05 ENCOUNTER — Encounter (HOSPITAL_COMMUNITY): Payer: Self-pay

## 2020-09-05 MED ORDER — KETOROLAC TROMETHAMINE 15 MG/ML IJ SOLN
15.0000 mg | Freq: Once | INTRAMUSCULAR | Status: AC
  Administered 2020-09-05: 15 mg via INTRAVENOUS
  Filled 2020-09-05: qty 1

## 2020-09-05 MED ORDER — HYDROMORPHONE HCL 1 MG/ML IJ SOLN
1.0000 mg | Freq: Once | INTRAMUSCULAR | Status: AC
  Administered 2020-09-05: 1 mg via INTRAVENOUS
  Filled 2020-09-05: qty 1

## 2020-09-05 MED ORDER — SODIUM CHLORIDE (PF) 0.9 % IJ SOLN
INTRAMUSCULAR | Status: AC
  Filled 2020-09-05: qty 50

## 2020-09-05 MED ORDER — METHOCARBAMOL 500 MG PO TABS: 500.0000 mg | ORAL_TABLET | Freq: Three times a day (TID) | ORAL | 0 refills | Status: DC | PRN

## 2020-09-05 MED ORDER — NAPROXEN 500 MG PO TABS: 500.0000 mg | ORAL_TABLET | Freq: Two times a day (BID) | ORAL | 0 refills | Status: DC | PRN

## 2020-09-05 MED ORDER — IOHEXOL 300 MG/ML  SOLN
100.0000 mL | Freq: Once | INTRAMUSCULAR | Status: AC | PRN
  Administered 2020-09-05: 100 mL via INTRAVENOUS

## 2020-09-05 NOTE — ED Notes (Signed)
Pt to CT

## 2020-09-05 NOTE — Discharge Instructions (Addendum)
You were seen in the emergency department today for abdominal pain.  Your CT scan showed that you have a hernia just above your bellybutton area.  There is some fatty tissue within the hernia.  We are sending home with the following medicines to help with pain:  - Naproxen is a nonsteroidal anti-inflammatory medication that will help with pain and swelling. Be sure to take this medication as prescribed with food, 1 pill every 12 hours,  It should be taken with food, as it can cause stomach upset, and more seriously, stomach bleeding. Do not take other nonsteroidal anti-inflammatory medications with this such as Advil, Motrin, Aleve, Mobic, Goodie Powder, or Motrin.    - Robaxin is the muscle relaxer I have prescribed, this is meant to help with muscle tightness. Be aware that this medication may make you drowsy therefore the first time you take this it should be at a time you are in an environment where you can rest. Do not drive or operate heavy machinery when taking this medication. Do not drink alcohol or take other sedating medications with this medicine such as narcotics or benzodiazepines.   You make take Tylenol per over the counter dosing with these medications.   We have prescribed you new medication(s) today. Discuss the medications prescribed today with your pharmacist as they can have adverse effects and interactions with your other medicines including over the counter and prescribed medications. Seek medical evaluation if you start to experience new or abnormal symptoms after taking one of these medicines, seek care immediately if you start to experience difficulty breathing, feeling of your throat closing, facial swelling, or rash as these could be indications of a more serious allergic reaction    Please follow-up with general surgery within 3 days for reevaluation and possible further management.  Return to the ER for new or worsening symptoms including but not limited to worsening pain,  new pain, vomiting, inability to keep fluids down, constipation/inability to have a bowel movement, inability to pass gas, fever, change of the color of the skin/appearance of the skin in your area of pain, or any other concerns.

## 2020-09-07 NOTE — Discharge Instructions (Signed)
I recommend that given the severity of your pain that you be further evaluated in the ER

## 2020-09-07 NOTE — ED Provider Notes (Signed)
Renaldo Fiddler    CSN: [id number] Arrival date & time: 09/04/20  1719      History   Chief Complaint Chief Complaint  Patient presents with  . Hernia    HPI Glenda Merritt is a 29 y.o. female.   Reports abdominal pain for the last 3 days.  Reports that the area pain surrounding her umbilicus.  Reports that this is happened intermittently in the past, but that if she takes ibuprofen or Tylenol with relief.  Reports the pain is so severe that she cannot move without feeling sharp stabbing pain.  Has not had this issue in the past.  Reports that the pain is persistent.  Reports that she has a bulge in her abdomen above her umbilicus.  Denies headache, cough, shortness of breath, nausea, vomiting, diarrhea, rash, fever, other symptoms.  ROS per HPI  The history is provided by the patient.    Past Medical History:  Diagnosis Date  . Asthma   . Eczema     Patient Active Problem List   Diagnosis Date Noted  . S/P laparoscopic cholecystectomy 08/01/2018  . Symptomatic cholelithiasis 08/01/2018    Past Surgical History:  Procedure Laterality Date  . CHOLECYSTECTOMY N/A 08/01/2018   Procedure: LAPAROSCOPIC CHOLECYSTECTOMY;  Surgeon: Andria Meuse, MD;  Location: [facility];  Service: General;  Laterality: N/A;  . TUBAL LIGATION      OB History   No obstetric history on file.      Home Medications    Prior to Admission medications   Medication Sig Start Date End Date Taking? Authorizing Provider  methocarbamol (ROBAXIN) 500 MG tablet Take 1 tablet (500 mg total) by mouth every 8 (eight) hours as needed for muscle spasms. 09/05/20   Petrucelli, Samantha R, PA-C  naproxen (NAPROSYN) 500 MG tablet Take 1 tablet (500 mg total) by mouth 2 (two) times daily as needed for moderate pain. 09/05/20   Petrucelli, Samantha R, PA-C  omeprazole (PRILOSEC) 20 MG capsule Take 1 capsule (20 mg total) by mouth daily. 07/31/18   Georgetta Haber, NP  ondansetron (ZOFRAN) 4 MG  tablet Take 1 tablet (4 mg total) by mouth every 6 (six) hours as needed for nausea or vomiting. 10/03/18   Dione Booze, MD  predniSONE (DELTASONE) 50 MG tablet Take 1 tablet (50 mg total) by mouth daily. 10/03/18   Dione Booze, MD  sucralfate (CARAFATE) 1 GM/10ML suspension Take 10 mLs (1 g total) by mouth 4 (four) times daily -  with meals and at bedtime. 07/31/18   Georgetta Haber, NP    Family History History reviewed. No pertinent family history.  Social History Social History   Tobacco Use  . Smoking status: Never Smoker  . Smokeless tobacco: Never Used  Substance Use Topics  . Alcohol use: Yes  . Drug use: Yes    Types: Marijuana     Allergies   Shellfish allergy, Lactose intolerance (gi), and Latex   Review of Systems Review of Systems   Physical Exam Triage Vital Signs ED Triage Vitals  Enc Vitals Group     BP 09/04/20 1742 131/88     Pulse Rate 09/04/20 1742 85     Resp 09/04/20 1742 16     Temp 09/04/20 1742 98 F (36.7 C)     Temp Source 09/04/20 1742 Oral     SpO2 09/04/20 1742 100 %     Weight --      Height --      Head  Circumference --      Peak Flow --      Pain Score 09/04/20 1740 10     Pain Loc --      Pain Edu? --      Excl. in GC? --    No data found.  Updated Vital Signs BP 131/88 (BP Location: Right Arm)   Pulse 85   Temp 98 F (36.7 C) (Oral)   Resp 16   LMP 09/01/2020   SpO2 100%      Physical Exam Vitals and nursing note reviewed.  Constitutional:      General: She is not in acute distress.    Appearance: She is well-developed. She is obese. She is ill-appearing.  HENT:     Head: Normocephalic and atraumatic.  Eyes:     Conjunctiva/sclera: Conjunctivae normal.  Cardiovascular:     Rate and Rhythm: Normal rate and regular rhythm.     Heart sounds: No murmur heard.   Pulmonary:     Effort: Pulmonary effort is normal. No respiratory distress.     Breath sounds: Normal breath sounds.  Abdominal:     Palpations:  Abdomen is soft.     Tenderness: There is no abdominal tenderness.     Hernia: A hernia is present.     Comments: Area of about 3 x 3 cm in diameter bulging from abdomen just above the umbilicus, is very tender to touch  Musculoskeletal:        General: Normal range of motion.     Cervical back: Normal range of motion and neck supple.  Skin:    General: Skin is warm and dry.     Capillary Refill: Capillary refill takes less than 2 seconds.  Neurological:     General: No focal deficit present.     Mental Status: She is alert and oriented to person, place, and time.  Psychiatric:        Mood and Affect: Mood normal.        Behavior: Behavior normal.        Thought Content: Thought content normal.      UC Treatments / Results  Labs (all labs ordered are listed, but only abnormal results are displayed) Labs Reviewed - No data to display  EKG   Radiology No results found.  Procedures Procedures (including critical care time)  Medications Ordered in UC Medications - No data to display  Initial Impression / Assessment and Plan / UC Course  I have reviewed the triage vital signs and the nursing notes.  Pertinent labs & imaging results that were available during my care of the patient were reviewed by me and considered in my medical decision making (see chart for details).    Periumbilical abdominal pain Periumbilical hernia  Given patient's pain severity in obvious discomfort, discussed with patient that she would be best served in the ER for further evaluation and treatment Concern for incarcerated hernia Discussed with patient that she needs further work-up including imaging and labs to rule this out Incarcerated hernia would require surgical repair, and they can control her pain in the ER Patient receptive, will go to the ER via private vehicle Stable at discharge  Final Clinical Impressions(s) / UC Diagnoses   Final diagnoses:  Periumbilical abdominal pain    Periumbilical hernia     Discharge Instructions     I recommend that given the severity of your pain that you be further evaluated in the ER    ED Prescriptions  None     PDMP not reviewed this encounter.   Moshe Cipro, NP 09/07/20 [phone]

## 2020-09-08 ENCOUNTER — Ambulatory Visit: Payer: Self-pay | Admitting: Surgery

## 2020-09-08 NOTE — H&P (Signed)
Glenda Merritt Appointment: 09/08/2020 10:00 AM Location: [facility] Patient #: [id number] DOB: 11/30/1990 Single / Language: [place] / Race: Black or African American Female  History of Present Illness Glenda Ore MD; 09/08/2020 10:45 AM) Patient words: Glenda Merritt is a 29 year old female who presents for evaluation of a hernia. She had a cholecystectomy on 08/01/2018, and has noticed since then a bulge develop near her umbilical port site. The hernia is uncomfortable and she wants it fixed. It starting to bother her in different positions and making her uncomfortable. 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 She would really like the hernia fixed.  The patient is a 29 year old female.   Allergies Glenda Merritt, [place]; 09/08/2020 10:17 AM) Latex Shellfish Allergies Reconciled  Medication History Glenda Merritt, [place]; 09/08/2020 10:17 AM) No Current Medications Medications Reconciled     Review of Systems Glenda Ore MD; 09/08/2020 10:45 AM) General Not Present- Appetite Loss, Chills, Fatigue, Fever, Night Sweats, Weight Gain and Weight Loss. Note: All other systems negative (unless as noted in HPI & included Review of Systems) Skin Not Present- Change in Wart/Mole, Dryness, Hives, Jaundice, New Lesions, Non-Healing Wounds, Rash and Ulcer. HEENT Not Present- Earache, Hearing Loss, Hoarseness, Nose Bleed, Oral Ulcers, Ringing in the Ears, Seasonal Allergies, Sinus Pain, Sore Throat, Visual Disturbances, Wears glasses/contact lenses and Yellow Eyes. Respiratory Not Present- Bloody sputum, Chronic Cough, Difficulty Breathing, Snoring and Wheezing. Breast Not Present- Breast Mass, Breast Pain, Nipple Discharge and Skin Changes. Cardiovascular Not Present- Chest Pain, Difficulty Breathing Lying Down, Leg Cramps, Palpitations, Rapid Heart Rate, Shortness of Breath and Swelling of  Extremities. Gastrointestinal Not Present- Abdominal Pain, Bloating, Bloody Stool, Change in Bowel Habits, Chronic diarrhea, Constipation, Difficulty Swallowing, Excessive gas, Gets full quickly at meals, Hemorrhoids, Indigestion, Nausea, Rectal Pain and Vomiting. Female Genitourinary Not Present- Frequency, Nocturia, Painful Urination, Pelvic Pain and Urgency. Musculoskeletal Not Present- Back Pain, Joint Pain, Joint Stiffness, Muscle Pain, Muscle Weakness and Swelling of Extremities. Neurological Not Present- Decreased Memory, Fainting, Headaches, Numbness, Seizures, Tingling, Tremor, Trouble walking and Weakness. Psychiatric Not Present- Anxiety, Bipolar, Change in Sleep Pattern, Depression, Fearful and Frequent crying. Endocrine Not Present- Cold Intolerance, Excessive Hunger, Hair Changes, Heat Intolerance, Hot flashes and New Diabetes. Hematology Not Present- Easy Bruising, Excessive bleeding, Gland problems, HIV and Persistent Infections.  Vitals Glenda Merritt Glenda Merritt; 09/08/2020 10:18 AM) 09/08/2020 10:17 AM Weight: 291.38 lb Height: 65in Body Surface Area: 2.32 m Body Mass Index: 48.49 kg/m  Temp.: 97.56F  Pulse: 76 (Regular)  BP: 126/78(Sitting, Left Arm, Standard)        Physical Exam Glenda Ore MD; 09/08/2020 10:46 AM)  General Mental Status-Alert. General Appearance-Consistent with stated age. Posture-Normal posture. Voice-Normal.  Integumentary Note: no rash or lesion on limited exam  Head and Neck Head -Note:atraumatic, normocephalic.  Face Strength and Tone - facial muscle strength and tone is normal.  Eye Eyeball - Bilateral-Extraocular movements intact. Sclera/Conjunctiva - Bilateral-Normal.  Chest and Lung Exam Chest and lung exam reveals -quiet, even and easy respiratory effort with no use of accessory muscles.  Abdomen Note: Soft, nontender, nondistended. Palpable partially reducible supraumbilical hernia  underlying the previous cholecystectomy incision scar. Skin is well healed.  Neuropsychiatric Mental status exam performed with findings of-able to articulate well with normal speech/language, rate, volume and coherence. The patient's mood and affect are described as -normal. Judgment and Insight-insight is appropriate concerning matters relevant  to self and the patient displays appropriate judgment regarding every day activities. Thought Processes/Cognitive Function-aware of current events.  Musculoskeletal Note: strength symmetrical throughout, no deformity    Assessment & Plan Glenda Ore MD; 09/08/2020 10:46 AM)  Glenda Merritt HERNIA (K43.2) Story: CT abdomen/pelvis 09/05/20 reviewed and interpreted independently - Fat containing supraumbilical hernia noted Impression: Glenda Merritt is a 29 year old female who presents with a ventral incisional hernia. I recommended robotic incisional hernia repair with mesh. The procedure itself as well as its risks, benefits, and alternatives were discussed with the patient. The risks discussed included but were not limited to the risk of infection, bleeding, damage to nearby structures, need for open operation, recurrent hernia and chronic pain. With this discussion completing all questions answered the patient granted consent to proceed. The surgery scheduler will call you to schedule surgery.

## 2020-10-11 ENCOUNTER — Encounter (HOSPITAL_COMMUNITY): Payer: Self-pay | Admitting: Emergency Medicine

## 2020-10-11 ENCOUNTER — Ambulatory Visit (HOSPITAL_COMMUNITY)
Admission: EM | Admit: 2020-10-11 | Discharge: 2020-10-11 | Disposition: A | Discharge status: Home / Self Care | Payer: Commercial Managed Care - PPO | Attending: Family Medicine | Admitting: Family Medicine

## 2020-10-11 ENCOUNTER — Other Ambulatory Visit: Payer: Self-pay

## 2020-10-11 DIAGNOSIS — U071 COVID-19: Secondary | ICD-10-CM | POA: Diagnosis not present

## 2020-10-11 DIAGNOSIS — Z20822 Contact with and (suspected) exposure to covid-19: Secondary | ICD-10-CM

## 2020-10-11 DIAGNOSIS — J069 Acute upper respiratory infection, unspecified: Secondary | ICD-10-CM | POA: Diagnosis present

## 2020-10-11 DIAGNOSIS — J4520 Mild intermittent asthma, uncomplicated: Secondary | ICD-10-CM | POA: Insufficient documentation

## 2020-10-11 LAB — SARS CORONAVIRUS 2 (TAT 6-24 HRS): SARS Coronavirus 2: POSITIVE — AB

## 2020-10-11 MED ORDER — ONDANSETRON HCL 4 MG PO TABS
4.0000 mg | ORAL_TABLET | Freq: Three times a day (TID) | ORAL | 0 refills | Status: AC | PRN
Start: 2020-10-11 — End: ?

## 2020-10-11 MED ORDER — TRAMADOL-ACETAMINOPHEN 37.5-325 MG PO TABS
1.0000 | ORAL_TABLET | Freq: Four times a day (QID) | ORAL | 0 refills | Status: AC | PRN
Start: 2020-10-11 — End: ?

## 2020-10-11 MED ORDER — ALBUTEROL SULFATE HFA 108 (90 BASE) MCG/ACT IN AERS
1.0000 | INHALATION_SPRAY | Freq: Four times a day (QID) | RESPIRATORY_TRACT | 0 refills | Status: AC | PRN
Start: 2020-10-11 — End: ?

## 2020-10-11 NOTE — Discharge Instructions (Addendum)
°  I have refilled your albuterol inhaler I have prescribed Ultracet to take as needed for abdominal pain.  Take this with a little fluid.  If it causes nausea and, I provided a prescription for Zofran for you to use. Go home to rest Drink plenty of fluids Take Tylenol for pain or fever You may take over-the-counter cough and cold medicines as needed You must quarantine at home until your test result is available You can check for your test result in MyChart

## 2020-10-11 NOTE — ED Provider Notes (Addendum)
[facility]    CSN: [id number] Arrival date & time: 10/11/20  5366      History   Chief Complaint Chief Complaint  Patient presents with  . Cough    HPI Glenda Merritt is a 30 y.o. female.   HPI   Patient states she has been sick for about 4 to 5 days.  She has cough and shortness of breath.  She does have some mild underlying asthma.  She does not currently have an inhaler but used to take albuterol as needed.  She does have some headache.  Does not know whether she had fever but she had chills.  She states everybody in the house is sick.  No known exposure to Covid.  Is not Covid vaccinated.  Past Medical History:  Diagnosis Date  . Asthma   . Eczema     Patient Active Problem List   Diagnosis Date Noted  . S/P laparoscopic cholecystectomy 08/01/2018  . Symptomatic cholelithiasis 08/01/2018    Past Surgical History:  Procedure Laterality Date  . CHOLECYSTECTOMY N/A 08/01/2018   Procedure: LAPAROSCOPIC CHOLECYSTECTOMY;  Surgeon: Andria Meuse, MD;  Location: [facility];  Service: General;  Laterality: N/A;  . TUBAL LIGATION      OB History   No obstetric history on file.      Home Medications    Prior to Admission medications   Medication Sig Start Date End Date Taking? Authorizing Provider  acetaminophen (TYLENOL) 325 MG tablet Take 650 mg by mouth every 6 (six) hours as needed.   Yes [provider]  albuterol (VENTOLIN HFA) 108 (90 Base) MCG/ACT inhaler Inhale 1-2 puffs into the lungs every 6 (six) hours as needed for wheezing or shortness of breath. 10/11/20  Yes Eustace Henry, MD  ondansetron (ZOFRAN) 4 MG tablet Take 1-2 tablets (4-8 mg total) by mouth every 8 (eight) hours as needed for nausea or vomiting. As needed nausea 10/11/20  Yes Eustace Zimmers, MD  traMADol-acetaminophen (ULTRACET) 37.5-325 MG tablet Take 1-2 tablets by mouth every 6 (six) hours as needed. 10/11/20  Yes Eustace Cranford, MD  omeprazole (PRILOSEC) 20 MG  capsule Take 1 capsule (20 mg total) by mouth daily. 07/31/18   Georgetta Haber, NP  sucralfate (CARAFATE) 1 GM/10ML suspension Take 10 mLs (1 g total) by mouth 4 (four) times daily -  with meals and at bedtime. 07/31/18   Georgetta Haber, NP    Family History Family History  Problem Relation Age of Onset  . Hypertension Mother     Social History Social History   Tobacco Use  . Smoking status: Never Smoker  . Smokeless tobacco: Never Used  Substance Use Topics  . Alcohol use: Yes  . Drug use: Not Currently    Types: Marijuana     Allergies   Shellfish allergy, Lactose intolerance (gi), and Latex   Review of Systems Review of Systems See HPI  Physical Exam Triage Vital Signs ED Triage Vitals  Enc Vitals Group     BP 10/11/20 0959 123/78     Pulse Rate 10/11/20 0959 81     Resp 10/11/20 0959 (!) 21     Temp 10/11/20 0959 98.4 F (36.9 C)     Temp Source 10/11/20 0959 Oral     SpO2 10/11/20 0959 100 %     Weight --      Height --      Head Circumference --      Peak Flow --  Pain Score 10/11/20 0955 10     Pain Loc --      Pain Edu? --      Excl. in [place]? --    No data found.  Updated Vital Signs BP 123/78 (BP Location: Right Arm) Comment (BP Location): large cuff  Pulse 81   Temp 98.4 F (36.9 C) (Oral)   Resp (!) 21   LMP 10/29/2019   SpO2 100%     Physical Exam Constitutional:      General: She is not in acute distress.    Appearance: She is well-developed and well-nourished.     Comments: Overweight  HENT:     Head: Normocephalic and atraumatic.     Mouth/Throat:     Mouth: Oropharynx is clear and moist. Mucous membranes are moist.  Eyes:     Conjunctiva/sclera: Conjunctivae normal.     Pupils: Pupils are equal, round, and reactive to light.  Cardiovascular:     Rate and Rhythm: Normal rate and regular rhythm.     Heart sounds: Normal heart sounds.  Pulmonary:     Effort: Pulmonary effort is normal. No respiratory distress.      Breath sounds: Normal breath sounds. No wheezing or rales.     Comments: Lungs are clear Abdominal:     Palpations: Abdomen is soft.  Musculoskeletal:        General: No edema. Normal range of motion.     Cervical back: Normal range of motion.  Skin:    General: Skin is warm and dry.  Neurological:     General: No focal deficit present.     Mental Status: She is alert.  Psychiatric:        Behavior: Behavior normal.      UC Treatments / Results  Labs (all labs ordered are listed, but only abnormal results are displayed) Labs Reviewed - No data to display  EKG   Radiology No results found.  Procedures Procedures (including critical care time)  Medications Ordered in UC Medications - No data to display  Initial Impression / Assessment and Plan / UC Course  I have reviewed the triage vital signs and the nursing notes.  Pertinent labs & imaging results that were available during my care of the patient were reviewed by me and considered in my medical decision making (see chart for details).     Reviewed symptomatic treatment.  Reviewed current quarantine recommendations for Covid.  Patient has abdominal hernia not yet repaired.  She states it is very symptomatic because of her coughing is causing her a lot of pain.  She tried Naprosyn for the pain but this made her vomit.  I am going to try Ultracet with food and see whether this is helpful.  I am giving her Zofran in case she does have nausea.  I am also refilling her albuterol inhaler Final Clinical Impressions(s) / UC Diagnoses   Final diagnoses:  Viral URI with cough  Encounter for laboratory testing for COVID-19 virus  Mild intermittent asthma, unspecified whether complicated     Discharge Instructions      I have refilled your albuterol inhaler I have prescribed Ultracet to take as needed for abdominal pain.  Take this with a little fluid.  If it causes nausea and, I provided a prescription for Zofran for you  to use. Go home to rest Drink plenty of fluids Take Tylenol for pain or fever You may take over-the-counter cough and cold medicines as needed You must quarantine at  home until your test result is available You can check for your test result in MyChart    ED Prescriptions    Medication Sig Dispense Auth. Provider   albuterol (VENTOLIN HFA) 108 (90 Base) MCG/ACT inhaler Inhale 1-2 puffs into the lungs every 6 (six) hours as needed for wheezing or shortness of breath. 18 g Eustace Roscher, MD   traMADol-acetaminophen (ULTRACET) 37.5-325 MG tablet Take 1-2 tablets by mouth every 6 (six) hours as needed. 20 tablet Eustace Cwynar, MD   ondansetron (ZOFRAN) 4 MG tablet Take 1-2 tablets (4-8 mg total) by mouth every 8 (eight) hours as needed for nausea or vomiting. As needed nausea 12 tablet Eustace Gorka, MD     I have reviewed the PDMP during this encounter.   Eustace Meissner, MD 10/11/20 1028    Eustace Aikman, MD 10/11/20 1028

## 2020-10-11 NOTE — ED Triage Notes (Signed)
Patient started feeling bad on 10/06/2020.  Cough, sore throat, headache, chills, sob, and unknown if she has had a fever

## 2022-02-10 IMAGING — CT CT ABD-PELV W/ CM
2 of 4 series · 16 of 46 positions shown, 18 images · IV contrast (OMNIPAQUE 300)
Comparison: None.

CLINICAL DATA: Acute abdominal pain for several days

EXAM:
CT ABDOMEN AND PELVIS WITH CONTRAST
TECHNIQUE: Multidetector CT imaging of the abdomen and pelvis was performed
using the standard protocol following bolus administration of
intravenous contrast.
CONTRAST:  100mL OMNIPAQUE IOHEXOL 300 MG/ML  SOLN

[Series 2: axial st · axial · 0.71mm/px · z∈[-624,-224]mm · 13 of 90 slices shown, 15 images]
[im 5/90  soft-tissue]
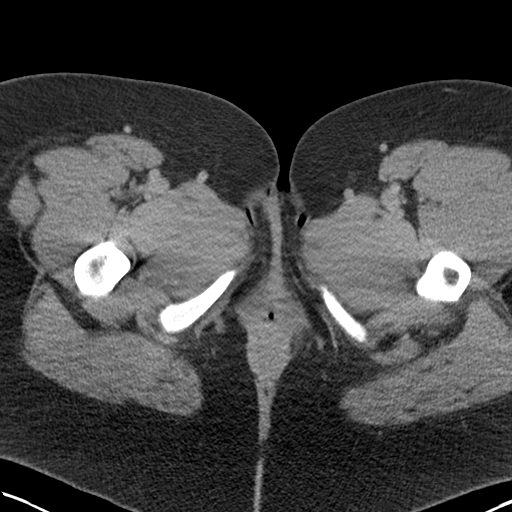
[im 5/90  bone]
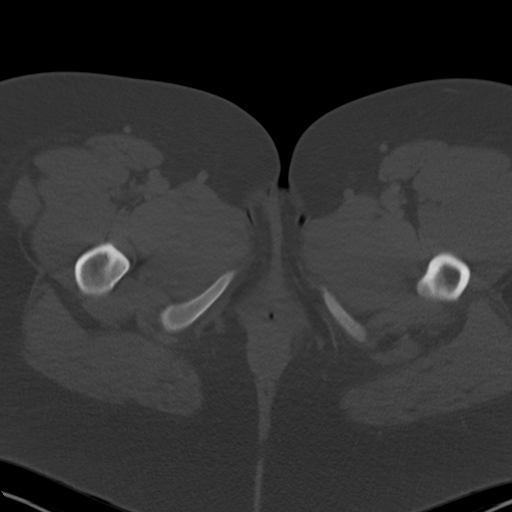
[im 14/90  soft-tissue]
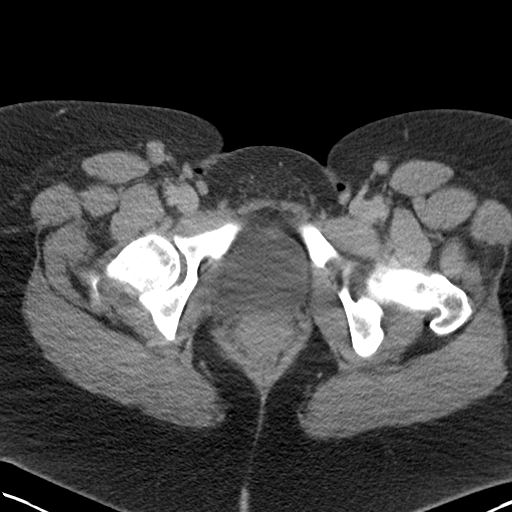
[im 18/90  soft-tissue]
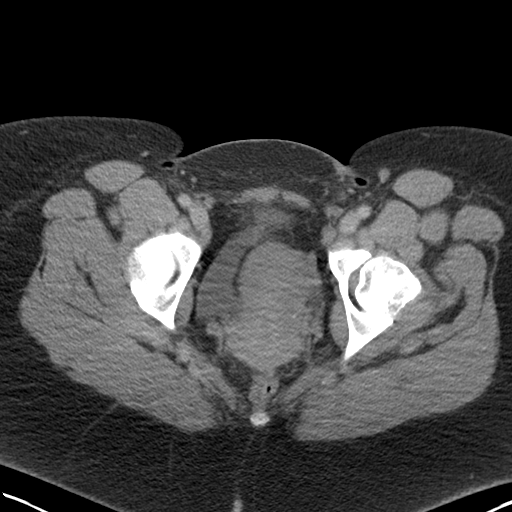
[im 27/90  soft-tissue]
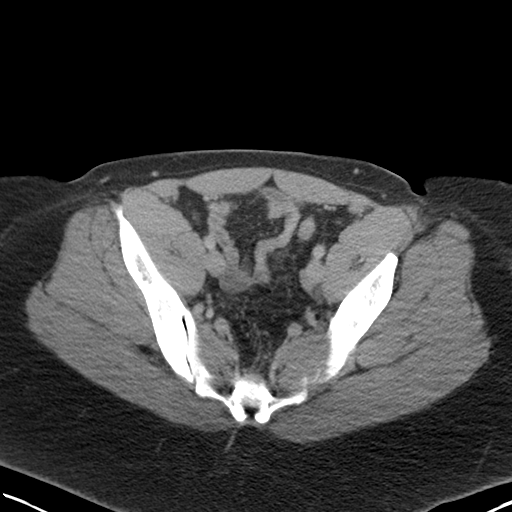
[im 32/90  soft-tissue]
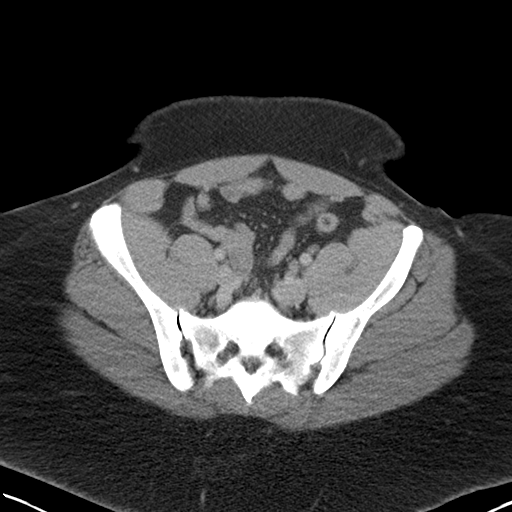
[im 41/90  soft-tissue]
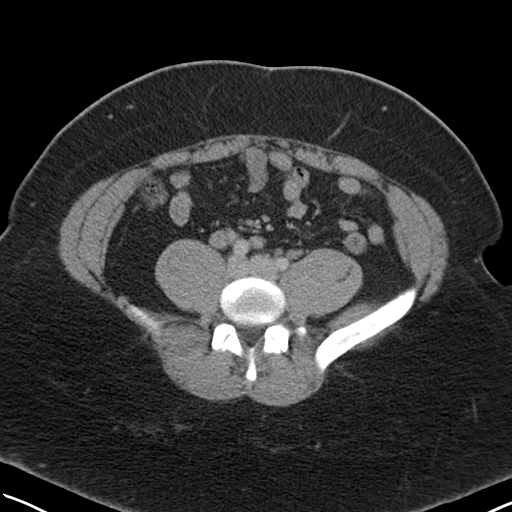
[im 45/90  soft-tissue]
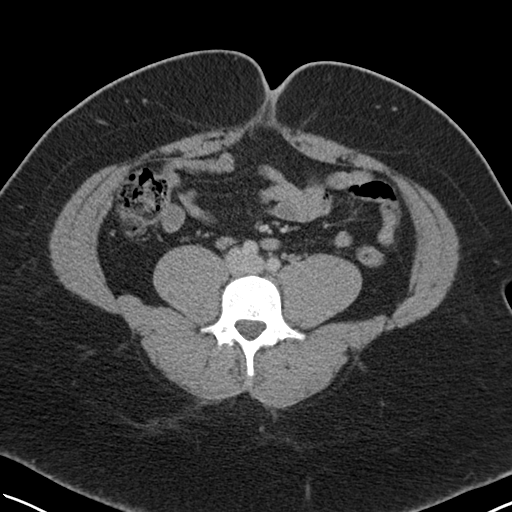
[im 49/90  soft-tissue]
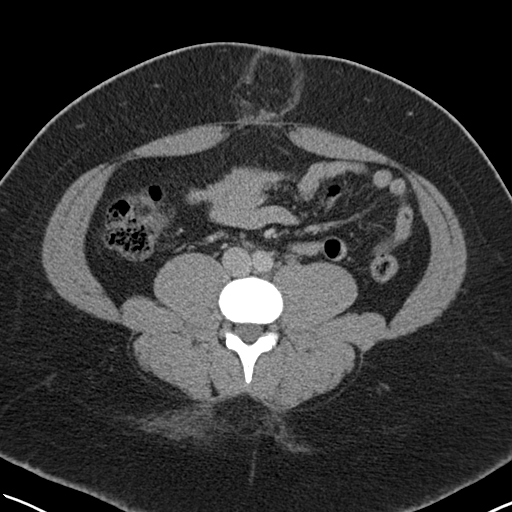
[im 58/90  soft-tissue]
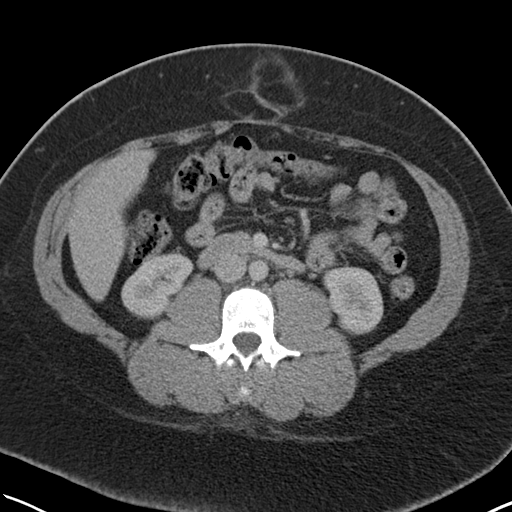
[im 58/90  bone]
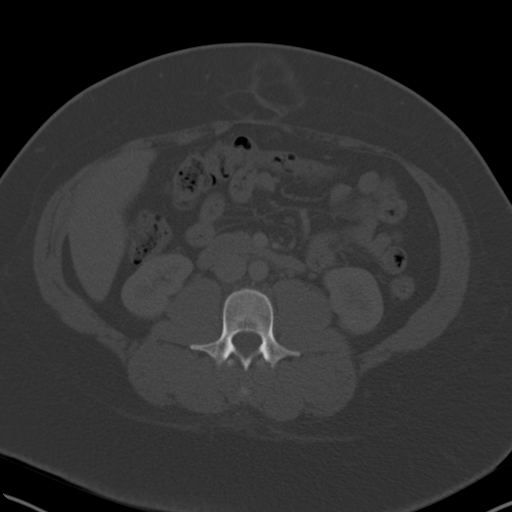
[im 63/90  soft-tissue]
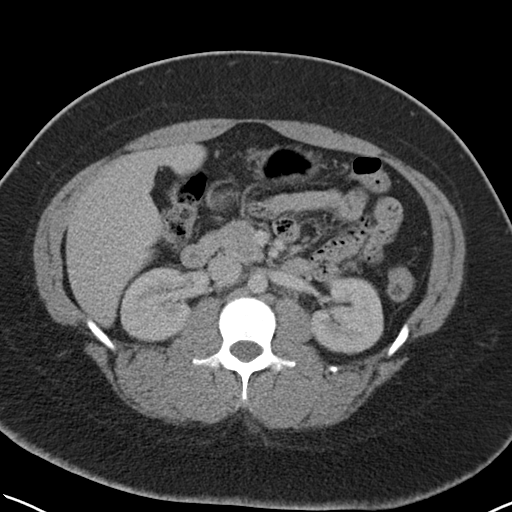
[im 72/90  soft-tissue]
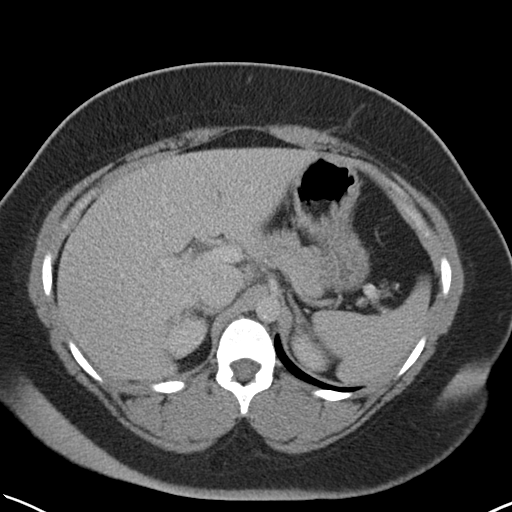
[im 76/90  soft-tissue]
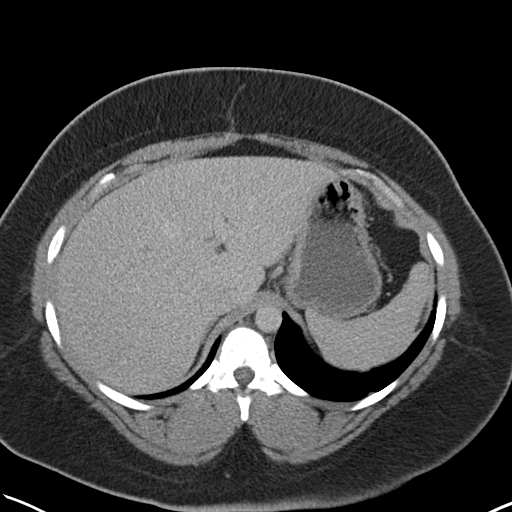
[im 85/90  soft-tissue]
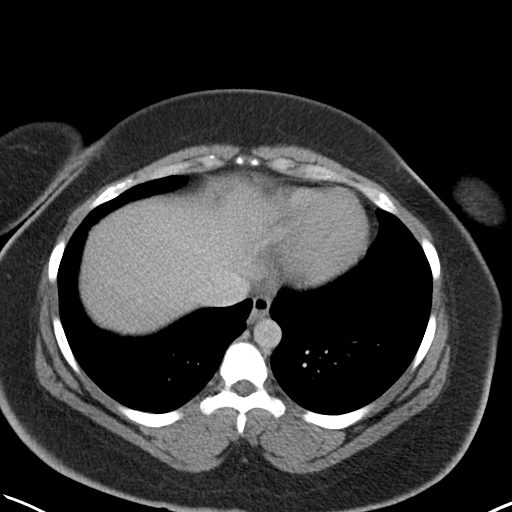

[Series 4: coronal st · coronal · 0.77mm/px · 3 of 166 slices shown]
[im 56/166  soft-tissue]
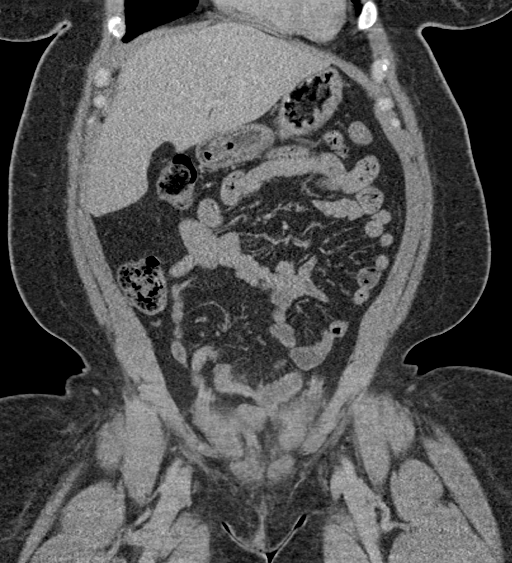
[im 74/166  soft-tissue]
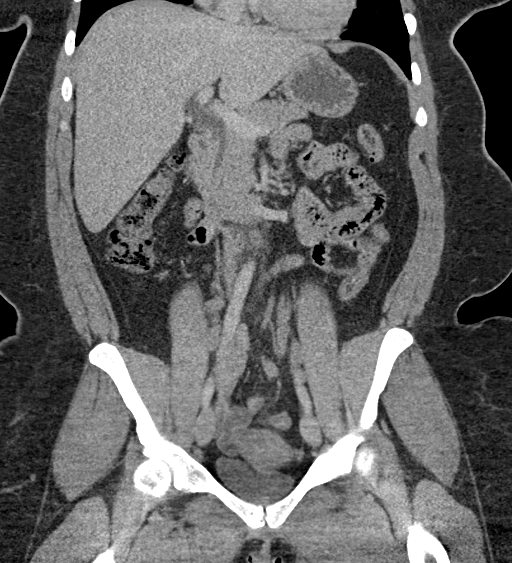
[im 92/166  soft-tissue]
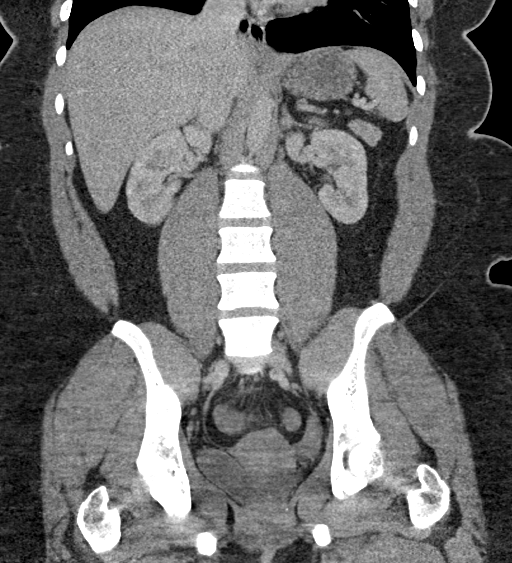

[16 of 46 positions shown; findings below may reference images not displayed]

FINDINGS: Lower chest: No acute abnormality.

Hepatobiliary: No focal liver abnormality is seen. Status post
cholecystectomy. No biliary dilatation.

Pancreas: Unremarkable. No pancreatic ductal dilatation or
surrounding inflammatory changes.

Spleen: Normal in size without focal abnormality.

Adrenals/Urinary Tract: Adrenal glands are within normal limits.
Kidneys demonstrate a normal enhancement pattern bilaterally. No
renal calculi or obstructive changes are seen. The bladder is
decompressed.

Stomach/Bowel: The appendix is within normal limits. No obstructive
inflammatory changes of the colon are seen. Small bowel and stomach
are within normal limits.

Vascular/Lymphatic: No significant vascular findings are present. No
enlarged abdominal or pelvic lymph nodes. Scattered inguinal nodes
are noted likely reactive in nature.

Reproductive: Uterus and bilateral adnexa are unremarkable.

Other: Fat containing supraumbilical hernia is noted. No bowel is
noted within. The mouth of the hernia measures approximately 3.7 x
3.5 cm in greatest transverse and craniocaudad projections.

Musculoskeletal: No acute or significant osseous findings.
IMPRESSION: Large fat containing supraumbilical hernia as described.

No other focal abnormality is noted.
